# Patient Record
Sex: Male | Born: 1990 | Race: White | Hispanic: No | Marital: Single | Smoking: Former smoker
Health system: Southern US, Community
[De-identification: ages and names within clinical notes are randomized; demographics above are authoritative.]

## PROBLEM LIST (undated history)

## (undated) DIAGNOSIS — Z8614 Personal history of Methicillin resistant Staphylococcus aureus infection: Secondary | ICD-10-CM

## (undated) DIAGNOSIS — F32A Depression, unspecified: Secondary | ICD-10-CM

## (undated) DIAGNOSIS — M2242 Chondromalacia patellae, left knee: Secondary | ICD-10-CM

## (undated) DIAGNOSIS — G9081 Serotonin syndrome: Secondary | ICD-10-CM

## (undated) DIAGNOSIS — F419 Anxiety disorder, unspecified: Secondary | ICD-10-CM

## (undated) DIAGNOSIS — S83289A Other tear of lateral meniscus, current injury, unspecified knee, initial encounter: Secondary | ICD-10-CM

## (undated) HISTORY — PX: ORIF FEMUR FRACTURE: SHX2119

## (undated) HISTORY — DX: Serotonin syndrome: G90.81

## (undated) HISTORY — PX: SEPTOPLASTY: SHX2393

## (undated) HISTORY — DX: Anxiety disorder, unspecified: F41.9

## (undated) HISTORY — PX: WISDOM TOOTH EXTRACTION: SHX21

## (undated) HISTORY — PX: TONSILLECTOMY AND ADENOIDECTOMY: SHX28

---
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
## 2011-03-05 LAB — POCT HEMOGLOBIN-HEMACUE: Hemoglobin: 15.8 — ABNORMAL HIGH

## 2011-05-13 ENCOUNTER — Other Ambulatory Visit: Payer: Self-pay | Admitting: Internal Medicine

## 2011-05-15 NOTE — Telephone Encounter (Signed)
Last OV and fill date 12/18/10

## 2011-05-19 NOTE — Telephone Encounter (Signed)
Pt mom called complaining that pharmacy advised them that they have sent several request without any response. Advise Pt mom Rx sent in to pharmacy and OV due in January.

## 2011-07-10 ENCOUNTER — Encounter: Payer: Self-pay | Admitting: Internal Medicine

## 2011-07-10 ENCOUNTER — Ambulatory Visit (INDEPENDENT_AMBULATORY_CARE_PROVIDER_SITE_OTHER): Payer: 59 | Admitting: Internal Medicine

## 2011-07-10 DIAGNOSIS — R55 Syncope and collapse: Secondary | ICD-10-CM

## 2011-07-10 DIAGNOSIS — F419 Anxiety disorder, unspecified: Secondary | ICD-10-CM

## 2011-07-10 DIAGNOSIS — Z0000 Encounter for general adult medical examination without abnormal findings: Secondary | ICD-10-CM | POA: Insufficient documentation

## 2011-07-10 LAB — COMPREHENSIVE METABOLIC PANEL
ALT: 23 U/L (ref 0–53)
AST: 20 U/L (ref 0–37)
Albumin: 4.1 g/dL (ref 3.5–5.2)
CO2: 28 mEq/L (ref 19–32)
Calcium: 9.2 mg/dL (ref 8.4–10.5)
Chloride: 106 mEq/L (ref 96–112)
GFR: 129.76 mL/min (ref >60.00)
Potassium: 3.8 mEq/L (ref 3.5–5.1)
Total Protein: 7 g/dL (ref 6.0–8.3)

## 2011-07-10 LAB — LIPID PANEL
LDL Cholesterol: 103 mg/dL — ABNORMAL HIGH (ref 0–99)
Total CHOL/HDL Ratio: 4

## 2011-07-10 NOTE — Assessment & Plan Note (Signed)
Asked pt to bring his immunization records  Counseled about: Diet-exercoise-STE-safe driving-safe sex Also risks if ETOH-tobacco-drug abuse

## 2011-07-10 NOTE — Assessment & Plan Note (Signed)
Control, will call for refills. C/o  insomnia, health sleep tips provided, will call if symptoms increase or no better

## 2011-07-10 NOTE — Assessment & Plan Note (Signed)
saw cardiology, they recommend no further workup, patient reports no further syncope

## 2011-07-10 NOTE — Patient Instructions (Signed)
Please get your immunization records to be sure you are update
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
## 2011-07-16 ENCOUNTER — Encounter: Payer: Self-pay | Admitting: Internal Medicine

## 2011-07-21 ENCOUNTER — Other Ambulatory Visit: Payer: Self-pay | Admitting: Internal Medicine

## 2011-07-21 NOTE — Telephone Encounter (Signed)
Refill done.  

## 2011-09-06 ENCOUNTER — Other Ambulatory Visit: Payer: Self-pay | Admitting: Internal Medicine

## 2011-09-09 ENCOUNTER — Other Ambulatory Visit: Payer: Self-pay | Admitting: *Deleted

## 2011-09-09 MED ORDER — CITALOPRAM HYDROBROMIDE 20 MG PO TABS: 20.0000 mg | ORAL_TABLET | Freq: Every day | ORAL | Status: DC
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
## 2011-12-25 ENCOUNTER — Other Ambulatory Visit: Payer: Self-pay | Admitting: Internal Medicine

## 2011-12-25 NOTE — Telephone Encounter (Signed)
Refill done.  

## 2012-02-17 ENCOUNTER — Ambulatory Visit
Admission: RE | Admit: 2012-02-17 | Discharge: 2012-02-17 | Disposition: A | Discharge status: Home / Self Care | Payer: 59 | Source: Ambulatory Visit | Attending: Family Medicine | Admitting: Family Medicine

## 2012-02-17 ENCOUNTER — Other Ambulatory Visit: Payer: Self-pay | Admitting: Family Medicine

## 2012-02-17 DIAGNOSIS — N200 Calculus of kidney: Secondary | ICD-10-CM

## 2012-02-17 DIAGNOSIS — R109 Unspecified abdominal pain: Secondary | ICD-10-CM

## 2012-06-12 ENCOUNTER — Other Ambulatory Visit: Payer: Self-pay | Admitting: Internal Medicine

## 2012-06-14 ENCOUNTER — Encounter: Payer: Self-pay | Admitting: *Deleted

## 2012-06-14 NOTE — Telephone Encounter (Signed)
Refill done.  Letter mailed to pt to advise him he is due for an OV for future refills.

## 2013-06-09 DIAGNOSIS — Z8614 Personal history of Methicillin resistant Staphylococcus aureus infection: Secondary | ICD-10-CM

## 2013-06-09 HISTORY — DX: Personal history of Methicillin resistant Staphylococcus aureus infection: Z86.14

## 2013-11-03 ENCOUNTER — Encounter: Payer: Self-pay | Admitting: Cardiovascular Disease

## 2013-11-03 ENCOUNTER — Ambulatory Visit (INDEPENDENT_AMBULATORY_CARE_PROVIDER_SITE_OTHER): Payer: 59 | Admitting: Cardiovascular Disease

## 2013-11-03 VITALS — BP 124/86 | HR 87 | Ht 70.0 in | Wt 244.9 lb

## 2013-11-03 DIAGNOSIS — F411 Generalized anxiety disorder: Secondary | ICD-10-CM

## 2013-11-03 DIAGNOSIS — F419 Anxiety disorder, unspecified: Secondary | ICD-10-CM

## 2013-11-03 NOTE — Assessment & Plan Note (Signed)
Patient is on Celexa. He was referred by his primary care physician to rule out any heart related conditions. He has no risk factors and is completely asymptomatic. He has had some episodes of syncope years ago but these have not been recurrent. He needs no further workup.

## 2013-11-03 NOTE — Progress Notes (Signed)
     11/03/2013 Derek Hoffman   [birth year]  [id number]  Primary Physician Sissy Hoff, MD Primary Cardiologist: Runell Gess MD Roseanne Reno   HPI:  Derek Hoffman is a 23 year old fit appearing single Caucasian male referred by his primary care physician for cardiovascular evaluation. He was seen by cardiology 3 years ago for syncope thought to be vagal. No further workup or episodes have occurred. He he has no risk factors for heart disease is completely asymptomatic.   Current Outpatient Prescriptions  Medication Sig Dispense Refill  . citalopram (CELEXA) 20 MG tablet take 1 tablet by mouth once daily  30 tablet  0  . esomeprazole (NEXIUM) 20 MG capsule Take 20 mg by mouth daily at 12 noon.       No current facility-administered medications for this visit.    Allergies  Allergen Reactions  . Codeine     History   Social History  . Marital Status: Single    Spouse Name: N/A    Number of Children: N/A  . Years of Education: N/A   Occupational History  . Not on file.   Social History Main Topics  . Smoking status: Former Smoker    Types: Cigarettes    Quit date: 06/09/2010  . Smokeless tobacco: Not on file  . Alcohol Use: Yes     Comment: rare  . Drug Use: No     Comment: No marijuana in >1 year  . Sexual Activity: Not on file   Other Topics Concern  . Not on file   Social History Narrative   Lives w/ MF   Regular exercise- no   Goes to college and works            Review of Systems: General: negative for chills, fever, night sweats or weight changes.  Cardiovascular: negative for chest pain, dyspnea on exertion, edema, orthopnea, palpitations, paroxysmal nocturnal dyspnea or shortness of breath Dermatological: negative for rash Respiratory: negative for cough or wheezing Urologic: negative for hematuria Abdominal: negative for nausea, vomiting, diarrhea, bright red blood per rectum, melena, or hematemesis Neurologic: negative for visual  changes, syncope, or dizziness All other systems reviewed and are otherwise negative except as noted above.    Blood pressure 124/86, pulse 87, height 5\' 10"  (1.778 m), weight 244 lb 14.4 oz (111.086 kg).  General appearance: alert and no distress Neck: no adenopathy, no carotid bruit, no JVD, supple, symmetrical, trachea midline and thyroid not enlarged, symmetric, no tenderness/mass/nodules Lungs: clear to auscultation bilaterally Heart: regular rate and rhythm, S1, S2 normal, no murmur, click, rub or gallop Extremities: extremities normal, atraumatic, no cyanosis or edema and 2+ pedal pulses are laterally  EKG normal sinus rhythm at 87 without ST or T wave changes  ASSESSMENT AND PLAN:   Anxiety Patient is on Celexa. He was referred by his primary care physician to rule out any heart related conditions. He has no risk factors and is completely asymptomatic. He has had some episodes of syncope years ago but these have not been recurrent. He needs no further workup.      Runell Gess MD FACP,FACC,FAHA, [facility] 11/03/2013 4:18 PM

## 2013-11-03 NOTE — Patient Instructions (Signed)
Your physician recommends that you schedule a follow-up appointment in: as needed. No changes were made today in your therapy.Marland Kitchen

## 2014-07-05 ENCOUNTER — Other Ambulatory Visit: Payer: Self-pay | Admitting: Physician Assistant

## 2014-07-05 DIAGNOSIS — R1011 Right upper quadrant pain: Secondary | ICD-10-CM

## 2014-07-05 DIAGNOSIS — R1013 Epigastric pain: Secondary | ICD-10-CM

## 2014-07-05 DIAGNOSIS — R197 Diarrhea, unspecified: Secondary | ICD-10-CM

## 2014-07-06 ENCOUNTER — Ambulatory Visit
Admission: RE | Admit: 2014-07-06 | Discharge: 2014-07-06 | Disposition: A | Discharge status: Home / Self Care | Payer: 59 | Source: Ambulatory Visit | Attending: Physician Assistant | Admitting: Physician Assistant

## 2014-07-06 DIAGNOSIS — R197 Diarrhea, unspecified: Secondary | ICD-10-CM

## 2014-07-06 DIAGNOSIS — R1013 Epigastric pain: Secondary | ICD-10-CM

## 2014-07-06 DIAGNOSIS — R1011 Right upper quadrant pain: Secondary | ICD-10-CM

## 2014-12-08 DIAGNOSIS — S83289A Other tear of lateral meniscus, current injury, unspecified knee, initial encounter: Secondary | ICD-10-CM

## 2014-12-08 DIAGNOSIS — M2242 Chondromalacia patellae, left knee: Secondary | ICD-10-CM

## 2014-12-08 HISTORY — DX: Chondromalacia patellae, left knee: M22.42

## 2014-12-08 HISTORY — DX: Other tear of lateral meniscus, current injury, unspecified knee, initial encounter: S83.289A

## 2015-01-01 ENCOUNTER — Other Ambulatory Visit: Payer: Self-pay | Admitting: Physician Assistant

## 2015-01-01 NOTE — H&P (Signed)
This is a pleasant 24 year-old gentleman who presents to our clinic today for follow up of his left knee.  He saw Slayden back in February of 2016 with left knee pain that he had for about two years.  We tried an intraarticular injection which really only gave him relief with Marcaine in place.  MRI ordered by Dr. Kramer early this year revealed fraying of the lateral meniscus without a discreet tear.   Past medical, social and family history reviewed in detail on the patient questionnaire and signed. Review of systems: As detailed in HPI.  All others reviewed and are negative.   EXAMINATION: Well-developed, well-nourished male in no acute distress.  Alert and oriented x 3.  Examination of his left knee reveals range of motion from 0-125 degrees.  Medial joint line tenderness.  Positive medial McMurray.  He is neurovascularly intact distally.    X-RAYS: Three views of his left knee reveal 50% narrowing of the medial compartment.  IMPRESSION: Degenerative medial meniscus tear, left knee.  PLAN: At this point in time Shamari has given this time, as well as an attempted intraarticular Cortisone injection to relieve his symptoms.  He continues to have symptoms and we feel it is appropriate to proceed with left knee arthroscopy and medial meniscectomy.  Risks, benefits and possible complications of surgery have been reviewed.  Rehab and recovery time discussed.  All questions answered.  We will see Derek Hoffman at the time of operative intervention.  Paperwork complete.  Daniel F. Murphy, M.D.  

## 2015-01-05 ENCOUNTER — Encounter (HOSPITAL_BASED_OUTPATIENT_CLINIC_OR_DEPARTMENT_OTHER): Payer: Self-pay | Admitting: *Deleted

## 2015-01-08 ENCOUNTER — Other Ambulatory Visit: Payer: Self-pay | Admitting: Physician Assistant

## 2015-01-08 NOTE — H&P (Signed)
This is a pleasant 24 year-old gentleman who presents to our clinic today for follow up of his left knee.  He saw Luby back in February of 2016 with left knee pain that he had for about two years.  We tried an intraarticular injection which really only gave him relief with Marcaine in place.  MRI ordered by Dr. Kramer early this year revealed fraying of the lateral meniscus without a discreet tear.   Past medical, social and family history reviewed in detail on the patient questionnaire and signed. Review of systems: As detailed in HPI.  All others reviewed and are negative.   EXAMINATION: Well-developed, well-nourished male in no acute distress.  Alert and oriented x 3.  Examination of his left knee reveals range of motion from 0-125 degrees.  Medial joint line tenderness.  Positive medial McMurray.  He is neurovascularly intact distally.    X-RAYS: Three views of his left knee reveal 50% narrowing of the medial compartment.  IMPRESSION: Degenerative medial meniscus tear, left knee.  PLAN: At this point in time Kairon has given this time, as well as an attempted intraarticular Cortisone injection to relieve his symptoms.  He continues to have symptoms and we feel it is appropriate to proceed with left knee arthroscopy and medial meniscectomy.  Risks, benefits and possible complications of surgery have been reviewed.  Rehab and recovery time discussed.  All questions answered.  We will see Jaquarious at the time of operative intervention.  Paperwork complete.  Daniel F. Murphy, M.D.  

## 2015-01-10 NOTE — Interval H&P Note (Signed)
History and Physical Interval Note:  01/10/2015 8:32 AM  Derek Hoffman  has presented today for surgery, with the diagnosis of CHONDROMALACIA PATELLAE LEFT KNEE OTHER TEAR OF LATERAL MENISCUS CURRENT INJURY UNSPECIFIED KNEE INITIAL ENCOUNTER   The various methods of treatment have been discussed with the patient and family. After consideration of risks, benefits and other options for treatment, the patient has consented to  Procedure(s): LEFT KNEE ARTHROSCOPY CHONDROPLASTY  WITH LATERAL MENISCECTOMY (Left) as a surgical intervention .  The patient's history has been reviewed, patient examined, no change in status, stable for surgery.  I have reviewed the patient's chart and labs.  Questions were answered to the patient's satisfaction.     [name]

## 2015-01-10 NOTE — H&P (View-Only) (Signed)
This is a pleasant 23 year-old gentleman who presents to our clinic today for follow up of his left knee.  He saw Sopheap back in February of 2016 with left knee pain that he had for about two years.  We tried an intraarticular injection which really only gave him relief with Marcaine in place.  MRI ordered by Dr. Kramer early this year revealed fraying of the lateral meniscus without a discreet tear.   Past medical, social and family history reviewed in detail on the patient questionnaire and signed. Review of systems: As detailed in HPI.  All others reviewed and are negative.   EXAMINATION: Well-developed, well-nourished male in no acute distress.  Alert and oriented x 3.  Examination of his left knee reveals range of motion from 0-125 degrees.  Medial joint line tenderness.  Positive medial McMurray.  He is neurovascularly intact distally.    X-RAYS: Three views of his left knee reveal 50% narrowing of the medial compartment.  IMPRESSION: Degenerative medial meniscus tear, left knee.  PLAN: At this point in time Tudor has given this time, as well as an attempted intraarticular Cortisone injection to relieve his symptoms.  He continues to have symptoms and we feel it is appropriate to proceed with left knee arthroscopy and medial meniscectomy.  Risks, benefits and possible complications of surgery have been reviewed.  Rehab and recovery time discussed.  All questions answered.  We will see Lawson at the time of operative intervention.  Paperwork complete.  Daniel F. Murphy, M.D.  

## 2015-01-11 ENCOUNTER — Ambulatory Visit (HOSPITAL_BASED_OUTPATIENT_CLINIC_OR_DEPARTMENT_OTHER): Payer: 59 | Admitting: Anesthesiology

## 2015-01-11 ENCOUNTER — Ambulatory Visit (HOSPITAL_BASED_OUTPATIENT_CLINIC_OR_DEPARTMENT_OTHER)
Admission: RE | Admit: 2015-01-11 | Discharge: 2015-01-11 | Disposition: A | Discharge status: Home / Self Care | Payer: 59 | Source: Ambulatory Visit | Attending: Orthopedic Surgery | Admitting: Orthopedic Surgery

## 2015-01-11 ENCOUNTER — Encounter (HOSPITAL_BASED_OUTPATIENT_CLINIC_OR_DEPARTMENT_OTHER): Payer: Self-pay | Admitting: Anesthesiology

## 2015-01-11 ENCOUNTER — Encounter (HOSPITAL_BASED_OUTPATIENT_CLINIC_OR_DEPARTMENT_OTHER)
Admission: RE | Disposition: A | Discharge status: Home / Self Care | Payer: Self-pay | Source: Ambulatory Visit | Attending: Orthopedic Surgery

## 2015-01-11 DIAGNOSIS — M2242 Chondromalacia patellae, left knee: Secondary | ICD-10-CM | POA: Diagnosis not present

## 2015-01-11 DIAGNOSIS — S83282A Other tear of lateral meniscus, current injury, left knee, initial encounter: Secondary | ICD-10-CM | POA: Insufficient documentation

## 2015-01-11 DIAGNOSIS — Z87891 Personal history of nicotine dependence: Secondary | ICD-10-CM | POA: Diagnosis not present

## 2015-01-11 DIAGNOSIS — X58XXXA Exposure to other specified factors, initial encounter: Secondary | ICD-10-CM | POA: Diagnosis not present

## 2015-01-11 HISTORY — DX: Chondromalacia patellae, left knee: M22.42

## 2015-01-11 HISTORY — PX: KNEE ARTHROSCOPY WITH LATERAL MENISECTOMY: SHX6193

## 2015-01-11 HISTORY — PX: CHONDROPLASTY: SHX5177

## 2015-01-11 HISTORY — DX: Other tear of lateral meniscus, current injury, unspecified knee, initial encounter: S83.289A

## 2015-01-11 HISTORY — DX: Personal history of Methicillin resistant Staphylococcus aureus infection: Z86.14

## 2015-01-11 HISTORY — PX: KNEE ARTHROSCOPY WITH EXCISION PLICA: SHX5647

## 2015-01-11 LAB — POCT HEMOGLOBIN-HEMACUE: HEMOGLOBIN: 16.3 g/dL (ref 13.0–17.0)

## 2015-01-11 SURGERY — ARTHROSCOPY, KNEE, WITH LATERAL MENISCECTOMY
Anesthesia: General | Site: Knee | Laterality: Left

## 2015-01-11 MED ORDER — PROPOFOL 10 MG/ML IV BOLUS
INTRAVENOUS | Status: DC | PRN
  Administered 2015-01-11: 250 mg via INTRAVENOUS

## 2015-01-11 MED ORDER — METHYLPREDNISOLONE ACETATE 80 MG/ML IJ SUSP
INTRAMUSCULAR | Status: AC
  Filled 2015-01-11: qty 1

## 2015-01-11 MED ORDER — LACTATED RINGERS IV SOLN: INTRAVENOUS | Status: DC

## 2015-01-11 MED ORDER — OXYCODONE-ACETAMINOPHEN 5-325 MG PO TABS: 1.0000 | ORAL_TABLET | ORAL | Status: DC | PRN

## 2015-01-11 MED ORDER — METOCLOPRAMIDE HCL 5 MG/ML IJ SOLN: 5.0000 mg | Freq: Three times a day (TID) | INTRAMUSCULAR | Status: DC | PRN

## 2015-01-11 MED ORDER — MIDAZOLAM HCL 2 MG/2ML IJ SOLN
1.0000 mg | INTRAMUSCULAR | Status: DC | PRN
  Administered 2015-01-11: 2 mg via INTRAVENOUS

## 2015-01-11 MED ORDER — BUPIVACAINE HCL (PF) 0.5 % IJ SOLN
INTRAMUSCULAR | Status: DC | PRN
  Administered 2015-01-11: 20 mL via INTRA_ARTICULAR

## 2015-01-11 MED ORDER — ONDANSETRON HCL 4 MG/2ML IJ SOLN
INTRAMUSCULAR | Status: DC | PRN
  Administered 2015-01-11: 4 mg via INTRAVENOUS

## 2015-01-11 MED ORDER — KETOROLAC TROMETHAMINE 30 MG/ML IJ SOLN
INTRAMUSCULAR | Status: DC | PRN
  Administered 2015-01-11: 30 mg via INTRAVENOUS

## 2015-01-11 MED ORDER — CEFAZOLIN SODIUM-DEXTROSE 2-3 GM-% IV SOLR
INTRAVENOUS | Status: AC
  Filled 2015-01-11: qty 50

## 2015-01-11 MED ORDER — BUPIVACAINE HCL (PF) 0.5 % IJ SOLN
INTRAMUSCULAR | Status: AC
  Filled 2015-01-11: qty 30

## 2015-01-11 MED ORDER — CEFAZOLIN SODIUM-DEXTROSE 2-3 GM-% IV SOLR
2.0000 g | INTRAVENOUS | Status: AC
  Administered 2015-01-11: 2 g via INTRAVENOUS

## 2015-01-11 MED ORDER — CHLORHEXIDINE GLUCONATE 4 % EX LIQD: 60.0000 mL | Freq: Once | CUTANEOUS | Status: DC

## 2015-01-11 MED ORDER — FENTANYL CITRATE (PF) 100 MCG/2ML IJ SOLN
50.0000 ug | INTRAMUSCULAR | Status: DC | PRN
  Administered 2015-01-11: 100 ug via INTRAVENOUS

## 2015-01-11 MED ORDER — LIDOCAINE HCL (CARDIAC) 20 MG/ML IV SOLN
INTRAVENOUS | Status: DC | PRN
  Administered 2015-01-11: 50 mg via INTRAVENOUS

## 2015-01-11 MED ORDER — HYDROMORPHONE HCL 1 MG/ML IJ SOLN: 0.5000 mg | INTRAMUSCULAR | Status: DC | PRN

## 2015-01-11 MED ORDER — HYDROMORPHONE HCL 1 MG/ML IJ SOLN: 0.2500 mg | INTRAMUSCULAR | Status: DC | PRN

## 2015-01-11 MED ORDER — DEXAMETHASONE SODIUM PHOSPHATE 4 MG/ML IJ SOLN
INTRAMUSCULAR | Status: DC | PRN
  Administered 2015-01-11: 10 mg via INTRAVENOUS

## 2015-01-11 MED ORDER — METHYLPREDNISOLONE ACETATE 80 MG/ML IJ SUSP
INTRAMUSCULAR | Status: DC | PRN
  Administered 2015-01-11: 80 mg via INTRA_ARTICULAR

## 2015-01-11 MED ORDER — METHOCARBAMOL 500 MG PO TABS: 500.0000 mg | ORAL_TABLET | Freq: Four times a day (QID) | ORAL | Status: DC | PRN

## 2015-01-11 MED ORDER — METOCLOPRAMIDE HCL 5 MG PO TABS: 5.0000 mg | ORAL_TABLET | Freq: Three times a day (TID) | ORAL | Status: DC | PRN

## 2015-01-11 MED ORDER — MORPHINE SULFATE 4 MG/ML IJ SOLN: 0.0500 mg/kg | INTRAMUSCULAR | Status: DC | PRN

## 2015-01-11 MED ORDER — LACTATED RINGERS IV SOLN
INTRAVENOUS | Status: DC
  Administered 2015-01-11: 08:00:00 via INTRAVENOUS

## 2015-01-11 MED ORDER — METHOCARBAMOL 1000 MG/10ML IJ SOLN: 500.0000 mg | Freq: Four times a day (QID) | INTRAVENOUS | Status: DC | PRN

## 2015-01-11 MED ORDER — ONDANSETRON HCL 4 MG/2ML IJ SOLN: 4.0000 mg | Freq: Four times a day (QID) | INTRAMUSCULAR | Status: DC | PRN

## 2015-01-11 MED ORDER — GLYCOPYRROLATE 0.2 MG/ML IJ SOLN: 0.2000 mg | Freq: Once | INTRAMUSCULAR | Status: DC | PRN

## 2015-01-11 MED ORDER — ONDANSETRON HCL 4 MG PO TABS: 4.0000 mg | ORAL_TABLET | Freq: Three times a day (TID) | ORAL | Status: DC | PRN

## 2015-01-11 MED ORDER — SCOPOLAMINE 1 MG/3DAYS TD PT72: 1.0000 | MEDICATED_PATCH | Freq: Once | TRANSDERMAL | Status: DC | PRN

## 2015-01-11 MED ORDER — MIDAZOLAM HCL 2 MG/2ML IJ SOLN
INTRAMUSCULAR | Status: AC
  Filled 2015-01-11: qty 2

## 2015-01-11 MED ORDER — ONDANSETRON HCL 4 MG PO TABS: 4.0000 mg | ORAL_TABLET | Freq: Four times a day (QID) | ORAL | Status: DC | PRN

## 2015-01-11 MED ORDER — FENTANYL CITRATE (PF) 100 MCG/2ML IJ SOLN
INTRAMUSCULAR | Status: AC
  Filled 2015-01-11: qty 6

## 2015-01-11 MED ORDER — SODIUM CHLORIDE 0.9 % IR SOLN
Status: DC | PRN
  Administered 2015-01-11: 5800 mL

## 2015-01-11 SURGICAL SUPPLY — 41 items
BANDAGE ELASTIC 6 VELCRO ST LF (GAUZE/BANDAGES/DRESSINGS) ×2 IMPLANT
BLADE CUDA 5.5 (BLADE) IMPLANT
BLADE CUDA GRT WHITE 3.5 (BLADE) IMPLANT
BLADE CUTTER GATOR 3.5 (BLADE) ×2 IMPLANT
BLADE CUTTER MENIS 5.5 (BLADE) IMPLANT
BLADE GREAT WHITE 4.2 (BLADE) ×2 IMPLANT
BUR OVAL 4.0 (BURR) IMPLANT
CUTTER MENISCUS  4.2MM (BLADE)
CUTTER MENISCUS 4.2MM (BLADE) IMPLANT
DRAPE ARTHROSCOPY W/POUCH 90 (DRAPES) ×2 IMPLANT
DURAPREP 26ML APPLICATOR (WOUND CARE) ×2 IMPLANT
ELECT MENISCUS 165MM 90D (ELECTRODE) IMPLANT
ELECT REM PT RETURN 9FT ADLT (ELECTROSURGICAL)
ELECTRODE REM PT RTRN 9FT ADLT (ELECTROSURGICAL) IMPLANT
GAUZE SPONGE 4X4 12PLY STRL (GAUZE/BANDAGES/DRESSINGS) ×4 IMPLANT
GAUZE XEROFORM 1X8 LF (GAUZE/BANDAGES/DRESSINGS) ×2 IMPLANT
GLOVE BIO SURGEON STRL SZ 6.5 (GLOVE) ×2 IMPLANT
GLOVE BIOGEL PI IND STRL 7.0 (GLOVE) ×4 IMPLANT
GLOVE BIOGEL PI INDICATOR 7.0 (GLOVE) ×4
GLOVE ECLIPSE 6.5 STRL STRAW (GLOVE) ×2 IMPLANT
GLOVE ECLIPSE 7.0 STRL STRAW (GLOVE) ×2 IMPLANT
GLOVE ORTHO TXT STRL SZ7.5 (GLOVE) IMPLANT
GLOVE SURG ORTHO 8.0 STRL STRW (GLOVE) ×2 IMPLANT
GOWN STRL REUS W/ TWL LRG LVL3 (GOWN DISPOSABLE) ×3 IMPLANT
GOWN STRL REUS W/ TWL XL LVL3 (GOWN DISPOSABLE) ×1 IMPLANT
GOWN STRL REUS W/TWL LRG LVL3 (GOWN DISPOSABLE) ×3
GOWN STRL REUS W/TWL XL LVL3 (GOWN DISPOSABLE) ×1
HOLDER KNEE FOAM BLUE (MISCELLANEOUS) ×2 IMPLANT
IV NS IRRIG 3000ML ARTHROMATIC (IV SOLUTION) ×4 IMPLANT
KNEE WRAP E Z 3 GEL PACK (MISCELLANEOUS) ×2 IMPLANT
MANIFOLD NEPTUNE II (INSTRUMENTS) ×2 IMPLANT
NEEDLE HYPO 22GX1.5 SAFETY (NEEDLE) ×2 IMPLANT
PACK ARTHROSCOPY DSU (CUSTOM PROCEDURE TRAY) ×2 IMPLANT
PACK BASIN DAY SURGERY FS (CUSTOM PROCEDURE TRAY) ×2 IMPLANT
PENCIL BUTTON HOLSTER BLD 10FT (ELECTRODE) IMPLANT
SET ARTHROSCOPY TUBING (MISCELLANEOUS) ×1
SET ARTHROSCOPY TUBING LN (MISCELLANEOUS) ×1 IMPLANT
SUT ETHILON 3 0 PS 1 (SUTURE) ×2 IMPLANT
SUT VIC AB 3-0 FS2 27 (SUTURE) IMPLANT
TOWEL OR 17X24 6PK STRL BLUE (TOWEL DISPOSABLE) ×2 IMPLANT
WATER STERILE IRR 1000ML POUR (IV SOLUTION) ×2 IMPLANT

## 2015-01-11 NOTE — Discharge Instructions (Signed)

## 2015-01-11 NOTE — H&P (View-Only) (Signed)
This is a pleasant 24 year-old gentleman who presents to our clinic today for follow up of his left knee.  He saw Andi back in February of 2016 with left knee pain that he had for about two years.  We tried an intraarticular injection which really only gave him relief with Marcaine in place.  MRI ordered by Dr. Farris Has early this year revealed fraying of the lateral meniscus without a discreet tear.   Past medical, social and family history reviewed in detail on the patient questionnaire and signed. Review of systems: As detailed in HPI.  All others reviewed and are negative.   EXAMINATION: Well-developed, well-nourished male in no acute distress.  Alert and oriented x 3.  Examination of his left knee reveals range of motion from 0-125 degrees.  Medial joint line tenderness.  Positive medial McMurray.  He is neurovascularly intact distally.    X-RAYS: Three views of his left knee reveal 50% narrowing of the medial compartment.  IMPRESSION: Degenerative medial meniscus tear, left knee.  PLAN: At this point in time Gillian has given this time, as well as an attempted intraarticular Cortisone injection to relieve his symptoms.  He continues to have symptoms and we feel it is appropriate to proceed with left knee arthroscopy and medial meniscectomy.  Risks, benefits and possible complications of surgery have been reviewed.  Rehab and recovery time discussed.  All questions answered.  We will see Daeshaun at the time of operative intervention.  Paperwork complete.  [name], M.D.

## 2015-01-11 NOTE — Anesthesia Preprocedure Evaluation (Addendum)
Anesthesia Evaluation  Patient identified by MRN, date of birth, ID band Patient awake    Reviewed: Allergy & Precautions, NPO status , Patient's Chart, lab work & pertinent test results  Airway Mallampati: I  TM Distance: >3 FB Neck ROM: Full    Dental   Pulmonary former smoker,  breath sounds clear to auscultation        Cardiovascular negative cardio ROS  Rhythm:Regular Rate:Normal     Neuro/Psych    GI/Hepatic negative GI ROS, Neg liver ROS,   Endo/Other    Renal/GU negative Renal ROS     Musculoskeletal   Abdominal   Peds  Hematology   Anesthesia Other Findings   Reproductive/Obstetrics                            Anesthesia Physical Anesthesia Plan  ASA: II  Anesthesia Plan:    Post-op Pain Management:    Induction: Intravenous  Airway Management Planned: LMA  Additional Equipment:   Intra-op Plan:   Post-operative Plan: Extubation in OR  Informed Consent: I have reviewed the patients History and Physical, chart, labs and discussed the procedure including the risks, benefits and alternatives for the proposed anesthesia with the patient or authorized representative who has indicated his/her understanding and acceptance.   Dental advisory given  Plan Discussed with: CRNA and Anesthesiologist  Anesthesia Plan Comments:         Anesthesia Quick Evaluation

## 2015-01-11 NOTE — Anesthesia Procedure Notes (Signed)
Procedure Name: LMA Insertion Performed by: York Grice Pre-anesthesia Checklist: Patient identified, Timeout performed, Emergency Drugs available, Suction available and Patient being monitored Patient Re-evaluated:Patient Re-evaluated prior to inductionOxygen Delivery Method: Circle system utilized Preoxygenation: Pre-oxygenation with 100% oxygen Intubation Type: IV induction Ventilation: Mask ventilation without difficulty LMA: LMA with gastric port inserted LMA Size: 4.0 Tube type: Oral Number of attempts: 1 Placement Confirmation: positive ETCO2 and breath sounds checked- equal and bilateral Tube secured with: Tape Dental Injury: Teeth and Oropharynx as per pre-operative assessment

## 2015-01-11 NOTE — Transfer of Care (Signed)
Immediate Anesthesia Transfer of Care Note  Patient: Derek Hoffman  Procedure(s) Performed: Procedure(s): LEFT KNEE ARTHROSCOPY  WITH LATERAL MENISCECTOMY, EXCISION OF MEDIAL PLICA, CHONDROPLASTY (Left) KNEE ARTHROSCOPY WITH EXCISION PLICA (Left) CHONDROPLASTY (Left)  Patient Location: PACU  Anesthesia Type:General  Level of Consciousness: awake and sedated  Airway & Oxygen Therapy: Patient Spontanous Breathing and Patient connected to face mask oxygen  Post-op Assessment: Report given to RN and Post -op Vital signs reviewed and stable  Post vital signs: Reviewed and stable  Last Vitals:  Filed Vitals:   01/11/15 0806  BP: 142/83  Pulse: 57  Temp: 36.7 C  Resp: 18    Complications: No apparent anesthesia complications

## 2015-01-11 NOTE — Anesthesia Postprocedure Evaluation (Signed)
  Anesthesia Post-op Note  Patient: Derek Hoffman  Procedure(s) Performed: Procedure(s): LEFT KNEE ARTHROSCOPY  WITH LATERAL MENISCECTOMY, EXCISION OF MEDIAL PLICA, CHONDROPLASTY (Left) KNEE ARTHROSCOPY WITH EXCISION PLICA (Left) CHONDROPLASTY (Left)  Patient Location: PACU  Anesthesia Type:General  Level of Consciousness: awake  Airway and Oxygen Therapy: Patient Spontanous Breathing  Post-op Pain: mild  Post-op Assessment: Post-op Vital signs reviewed LLE Motor Response: Purposeful movement LLE Sensation: Full sensation          Post-op Vital Signs: Reviewed  Last Vitals:  Filed Vitals:   01/11/15 1145  BP: 140/86  Pulse: 66  Temp: 36.7 C  Resp: 20    Complications: No apparent anesthesia complications

## 2015-01-11 NOTE — Interval H&P Note (Signed)
History and Physical Interval Note:  01/11/2015 9:44 AM  Derek Hoffman  has presented today for surgery, with the diagnosis of CHONDROMALACIA PATELLAE LEFT KNEE OTHER TEAR OF LATERAL MENISCUS CURRENT INJURY UNSPECIFIED KNEE INITIAL ENCOUNTER   The various methods of treatment have been discussed with the patient and family. After consideration of risks, benefits and other options for treatment, the patient has consented to  Procedure(s): LEFT KNEE ARTHROSCOPY CHONDROPLASTY  WITH LATERAL MENISCECTOMY (Left) as a surgical intervention .  The patient's history has been reviewed, patient examined, no change in status, stable for surgery.  I have reviewed the patient's chart and labs.  Questions were answered to the patient's satisfaction.     [name]

## 2015-01-12 ENCOUNTER — Encounter (HOSPITAL_BASED_OUTPATIENT_CLINIC_OR_DEPARTMENT_OTHER): Payer: Self-pay | Admitting: Orthopedic Surgery

## 2015-01-12 NOTE — Op Note (Signed)
NAME:  Derek Hoffman, Derek Hoffman NO.:  [id number]  MEDICAL RECORD NO.:  [id number]  LOCATION:                               FACILITY:  [facility]  PHYSICIAN:  [name], M.D. DATE OF BIRTH:  June 05, 1991  DATE OF PROCEDURE:  01/11/2015 DATE OF DISCHARGE:  01/11/2015                              OPERATIVE REPORT   PREOPERATIVE DIAGNOSIS:  Lateral meniscus tear, left knee.  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  PROCEDURE:  Left knee exam under anesthesia, arthroscopy.  Partial lateral meniscectomy.  Chondroplasty of patella and lateral compartment. Excised medial plica.  SURGEON:  [name], MD.  ASSISTANT:  Mikey Kirschner, PA.  ANESTHESIA:  General.  BLOOD LOSS:  Minimal.  SPECIMENS:  None.  CULTURES:  None.  COMPLICATIONS:  None.  DRESSINGS:  Soft compressive.  TOURNIQUET:  Not employed.  DESCRIPTION OF PROCEDURE:  The patient was brought to the operating room, placed on the operating table in supine position.  After adequate anesthesia had been obtained, a leg holder applied.  Leg prepped and draped in usual sterile fashion.  Two portals, one each medial and lateral parapatellar.  Arthroscope was introduced.  Knee distended and inspected.  Reasonable patellar tracking.  Large fibrotic medial plica excised.  Area of grooving on the medial femoral condyle under the plica debrided.  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  Tracking acceptable.  Medial meniscus, medial compartment, and cruciate ligaments intact.  A little ACL laxity __________ to firm endpoint.  Radial tearing middle and anterior third lateral meniscus. Saucerized out to a stable rim, tapered in smoothly.  Grade 2 changes on the plateau and femur debrided.  Entire knee examined, no  other findings were appreciated.  Instruments and fluid removed.  Portals were closed with nylon.  Knee injected with Depo-Medrol and Marcaine. Sterile compressive dressing applied.  Anesthesia reversed.  Brought to the recovery room.  Tolerated the surgery well.  No complications.     [name], M.D.     DFM/MEDQ  D:  01/11/2015  T:  01/11/2015  Job:  [id number]

## 2015-03-06 DIAGNOSIS — G8929 Other chronic pain: Secondary | ICD-10-CM | POA: Insufficient documentation

## 2015-03-07 ENCOUNTER — Other Ambulatory Visit: Payer: Self-pay | Admitting: Orthopedic Surgery

## 2015-03-07 DIAGNOSIS — M25562 Pain in left knee: Secondary | ICD-10-CM

## 2015-03-15 ENCOUNTER — Other Ambulatory Visit: Payer: Self-pay | Admitting: Orthopedic Surgery

## 2015-03-15 ENCOUNTER — Ambulatory Visit
Admission: RE | Admit: 2015-03-15 | Discharge: 2015-03-15 | Disposition: A | Discharge status: Home / Self Care | Payer: 59 | Source: Ambulatory Visit | Attending: Orthopedic Surgery | Admitting: Orthopedic Surgery

## 2015-03-15 DIAGNOSIS — Z139 Encounter for screening, unspecified: Secondary | ICD-10-CM

## 2015-03-15 DIAGNOSIS — M25562 Pain in left knee: Secondary | ICD-10-CM

## 2015-03-20 DIAGNOSIS — M1732 Unilateral post-traumatic osteoarthritis, left knee: Secondary | ICD-10-CM | POA: Insufficient documentation

## 2015-05-28 DIAGNOSIS — Z9889 Other specified postprocedural states: Secondary | ICD-10-CM | POA: Insufficient documentation

## 2015-09-03 DIAGNOSIS — S76112A Strain of left quadriceps muscle, fascia and tendon, initial encounter: Secondary | ICD-10-CM | POA: Insufficient documentation

## 2016-08-28 ENCOUNTER — Emergency Department (HOSPITAL_COMMUNITY)
Admission: EM | Admit: 2016-08-28 | Discharge: 2016-08-28 | Disposition: A | Discharge status: Home / Self Care | Payer: 59 | Attending: Emergency Medicine | Admitting: Emergency Medicine

## 2016-08-28 ENCOUNTER — Emergency Department (HOSPITAL_COMMUNITY): Payer: 59

## 2016-08-28 ENCOUNTER — Encounter (HOSPITAL_COMMUNITY): Payer: Self-pay

## 2016-08-28 DIAGNOSIS — Z7982 Long term (current) use of aspirin: Secondary | ICD-10-CM | POA: Diagnosis not present

## 2016-08-28 DIAGNOSIS — Z79899 Other long term (current) drug therapy: Secondary | ICD-10-CM | POA: Diagnosis not present

## 2016-08-28 DIAGNOSIS — Z87891 Personal history of nicotine dependence: Secondary | ICD-10-CM | POA: Insufficient documentation

## 2016-08-28 DIAGNOSIS — R1084 Generalized abdominal pain: Secondary | ICD-10-CM | POA: Diagnosis present

## 2016-08-28 DIAGNOSIS — K2900 Acute gastritis without bleeding: Secondary | ICD-10-CM | POA: Insufficient documentation

## 2016-08-28 LAB — CBC
HCT: 48.5 % (ref 39.0–52.0)
HEMOGLOBIN: 16.7 g/dL (ref 13.0–17.0)
MCH: 29 pg (ref 26.0–34.0)
MCHC: 34.4 g/dL (ref 30.0–36.0)
MCV: 84.3 fL (ref 78.0–100.0)
PLATELETS: 183 10*3/uL (ref 150–400)
RBC: 5.75 MIL/uL (ref 4.22–5.81)
RDW: 12.6 % (ref 11.5–15.5)
WBC: 7.3 10*3/uL (ref 4.0–10.5)

## 2016-08-28 LAB — COMPREHENSIVE METABOLIC PANEL
ALK PHOS: 78 U/L (ref 38–126)
ALT: 36 U/L (ref 17–63)
ANION GAP: 8 (ref 5–15)
AST: 29 U/L (ref 15–41)
Albumin: 4.3 g/dL (ref 3.5–5.0)
BUN: 9 mg/dL (ref 6–20)
CALCIUM: 9.8 mg/dL (ref 8.9–10.3)
CO2: 30 mmol/L (ref 22–32)
CREATININE: 1.03 mg/dL (ref 0.61–1.24)
Chloride: 102 mmol/L (ref 101–111)
Glucose, Bld: 89 mg/dL (ref 65–99)
Potassium: 4.1 mmol/L (ref 3.5–5.1)
SODIUM: 140 mmol/L (ref 135–145)
TOTAL PROTEIN: 7.4 g/dL (ref 6.5–8.1)
Total Bilirubin: 0.8 mg/dL (ref 0.3–1.2)

## 2016-08-28 LAB — URINALYSIS, ROUTINE W REFLEX MICROSCOPIC
Bilirubin Urine: NEGATIVE
Glucose, UA: NEGATIVE mg/dL
HGB URINE DIPSTICK: NEGATIVE
Ketones, ur: NEGATIVE mg/dL
LEUKOCYTES UA: NEGATIVE
NITRITE: NEGATIVE
PROTEIN: NEGATIVE mg/dL
SPECIFIC GRAVITY, URINE: 1.006 (ref 1.005–1.030)
pH: 6 (ref 5.0–8.0)

## 2016-08-28 LAB — LIPASE, BLOOD: Lipase: 21 U/L (ref 11–51)

## 2016-08-28 MED ORDER — SUCRALFATE 1 GM/10ML PO SUSP: 1.0000 g | Freq: Three times a day (TID) | ORAL | 0 refills | Status: DC

## 2016-08-28 MED ORDER — ONDANSETRON HCL 4 MG PO TABS: 4.0000 mg | ORAL_TABLET | Freq: Four times a day (QID) | ORAL | 0 refills | Status: DC | PRN

## 2016-08-28 MED ORDER — MORPHINE SULFATE (PF) 4 MG/ML IV SOLN
4.0000 mg | Freq: Once | INTRAVENOUS | Status: AC
  Administered 2016-08-28: 4 mg via INTRAVENOUS
  Filled 2016-08-28: qty 1

## 2016-08-28 MED ORDER — PANTOPRAZOLE SODIUM 40 MG IV SOLR
40.0000 mg | Freq: Once | INTRAVENOUS | Status: AC
  Administered 2016-08-28: 40 mg via INTRAVENOUS
  Filled 2016-08-28: qty 40

## 2016-08-28 MED ORDER — SODIUM CHLORIDE 0.9 % IV BOLUS (SEPSIS)
2000.0000 mL | Freq: Once | INTRAVENOUS | Status: AC
  Administered 2016-08-28: 2000 mL via INTRAVENOUS

## 2016-08-28 MED ORDER — PANTOPRAZOLE SODIUM 20 MG PO TBEC: 20.0000 mg | DELAYED_RELEASE_TABLET | Freq: Two times a day (BID) | ORAL | 0 refills | Status: DC

## 2016-08-28 MED ORDER — IOPAMIDOL (ISOVUE-300) INJECTION 61%
INTRAVENOUS | Status: AC
  Administered 2016-08-28: 100 mL
  Filled 2016-08-28: qty 100

## 2016-08-28 MED ORDER — ONDANSETRON HCL 4 MG/2ML IJ SOLN
4.0000 mg | Freq: Once | INTRAMUSCULAR | Status: AC
  Administered 2016-08-28: 4 mg via INTRAVENOUS
  Filled 2016-08-28: qty 2

## 2016-08-28 NOTE — ED Notes (Signed)
Pt tolerated gingerale and ate all of peanut butter and crackers. Dr. Ranae PalmsYelverton aware.

## 2016-08-28 NOTE — ED Provider Notes (Signed)
[facility]-EMERGENCY DEPT Provider Note   CSN: [id number] Arrival date & time: 08/28/16  1510     History   Chief Complaint Chief Complaint  Patient presents with  . Abdominal Pain    HPI Derek Hoffman is a 26 y.o. male.  HPI Patient presents with abdominal pain starting 5 days ago. States he was out drinking alcohol the night before. Abdominal pain is mostly in the upper abdomen. Associated with multiple bouts of vomiting. No blood or coffee ground emesis. Patient has had a few loose stools but no evidence of blood. No fever or chills. Has had decreased appetite. Denies frequent NSAID intake. Was seen in urgent care and sent to the emergency department to rule out appendicitis. Past Medical History:  Diagnosis Date  . Anxiety   . 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 History of MRSA infection 2015   left arm  . Lateral meniscus tear 12/2014   left    Patient Active Problem List   Diagnosis Date Noted  . General medical examination 07/10/2011  . Overweight(278.02) 12/26/2010  . Anxiety 12/18/2010    Past Surgical History:  Procedure Laterality Date  . CHONDROPLASTY Left 01/11/2015   Procedure: CHONDROPLASTY;  Surgeon: [name], MD;  Location: [facility];  Service: Orthopedics;  Laterality: Left;  . FEMUR HARDWARE REMOVAL Left 10/28/2007  . KNEE ARTHROSCOPY Left 10/28/2007   with manipulation  . KNEE ARTHROSCOPY WITH EXCISION PLICA Left 01/11/2015   Procedure: KNEE ARTHROSCOPY WITH EXCISION PLICA;  Surgeon: [name], MD;  Location: [facility];  Service: Orthopedics;  Laterality: Left;  . KNEE ARTHROSCOPY WITH LATERAL MENISECTOMY Left 01/11/2015   Procedure: LEFT KNEE ARTHROSCOPY  WITH LATERAL MENISCECTOMY, EXCISION OF MEDIAL PLICA, CHONDROPLASTY;  Surgeon: [name], MD;  Location: [facility];  Service: Orthopedics;  Laterality: Left;  . ORIF FEMUR FRACTURE Left   . SEPTOPLASTY    . TONSILLECTOMY AND ADENOIDECTOMY     . WISDOM TOOTH EXTRACTION         Home Medications    Prior to Admission medications   Medication Sig Start Date End Date Taking? Authorizing Provider  Aspirin Effervescent (ALKA-SELTZER EXTRA STRENGTH) 500 MG TBEF Take 1-2 tablets by mouth every 6 (six) hours as needed (for cold symptoms).   Yes Historical Provider, MD  citalopram (CELEXA) 20 MG tablet take 1 tablet by mouth once daily Patient taking differently: Take 20 mg by mouth once a day 06/12/12  Yes Wanda Plump, MD  loratadine (CLARITIN) 10 MG tablet Take 10 mg by mouth daily as needed for allergies.   Yes Historical Provider, MD  VYVANSE 30 MG capsule Take 30 mg by mouth every morning. ONLY ON WORKDAYS 08/04/16  Yes Historical Provider, MD  ondansetron (ZOFRAN) 4 MG tablet Take 1 tablet (4 mg total) by mouth 4 (four) times daily as needed for nausea or vomiting. 08/28/16   Loren Racer, MD  oxyCODONE-acetaminophen (ROXICET) 5-325 MG per tablet Take 1-2 tablets by mouth every 4 (four) hours as needed. Patient not taking: Reported on 08/28/2016 01/11/15   Cristie Hem, PA-C  pantoprazole (PROTONIX) 20 MG tablet Take 1 tablet (20 mg total) by mouth 2 (two) times daily. 08/28/16   Loren Racer, MD  sucralfate (CARAFATE) 1 GM/10ML suspension Take 10 mLs (1 g total) by mouth 4 (four) times daily -  with meals and at bedtime. 08/28/16   Loren Racer, MD    Family History Family History  Problem Relation  Age of Onset  . Hypertension Father     Social History Social History  Substance Use Topics  . Smoking status: Former Smoker    Quit date: 06/08/2010  . Smokeless tobacco: Current User    Types: Snuff  . Alcohol use Yes     Comment: occasionally     Allergies   Patient has no known allergies.   Review of Systems Review of Systems  Constitutional: Positive for fatigue. Negative for chills and fever.  Respiratory: Negative for cough and shortness of breath.   Cardiovascular: Negative for chest pain.    Gastrointestinal: Positive for abdominal pain, diarrhea, nausea and vomiting. Negative for blood in stool and constipation.  Genitourinary: Negative for dysuria, flank pain, frequency and hematuria.  Musculoskeletal: Negative for back pain, myalgias, neck pain and neck stiffness.  Neurological: Negative for syncope, weakness, numbness and headaches.  All other systems reviewed and are negative.    Physical Exam Updated Vital Signs BP (!) 143/86 (BP Location: Right Arm)   Pulse 83   Temp 98.3 F (36.8 C) (Oral)   Resp 16   Ht 5\' 11"  (1.803 m)   Wt 245 lb (111.1 kg)   SpO2 98%   BMI 34.17 kg/m   Physical Exam  Constitutional: He is oriented to person, place, and time. He appears well-developed and well-nourished. No distress.  HENT:  Head: Normocephalic and atraumatic.  Mouth/Throat: Oropharynx is clear and moist. No oropharyngeal exudate.  Eyes: EOM are normal. Pupils are equal, round, and reactive to light.  Neck: Normal range of motion. Neck supple.  Cardiovascular: Normal rate and regular rhythm.  Exam reveals no gallop and no friction rub.   No murmur heard. Pulmonary/Chest: Effort normal and breath sounds normal. No respiratory distress. He has no wheezes. He has no rales. He exhibits no tenderness.  Abdominal: Soft. Bowel sounds are normal. There is tenderness. There is no rebound and no guarding.  Diffusely tender abdomen that appears most tender in the epigastric region. Does have mild right lower quadrant tenderness but there is no rebound or guarding. Rovsing sign is negative.  Musculoskeletal: Normal range of motion. He exhibits no edema or tenderness.  No CVA tenderness bilaterally.  Neurological: He is alert and oriented to person, place, and time.  Moving all extremities without deficit. Sensation fully intact.  Skin: Skin is warm and dry. No rash noted. No erythema.  Psychiatric: He has a normal mood and affect. His behavior is normal.  Nursing note and vitals  reviewed.    ED Treatments / Results  Labs (all labs ordered are listed, but only abnormal results are displayed) Labs Reviewed  URINALYSIS, ROUTINE W REFLEX MICROSCOPIC - Abnormal; Notable for the following:       Result Value   Color, Urine STRAW (*)    All other components within normal limits  LIPASE, BLOOD  COMPREHENSIVE METABOLIC PANEL  CBC    EKG  EKG Interpretation None       Radiology Ct Abdomen Pelvis W Contrast  Result Date: 08/28/2016 CLINICAL DATA:  Acute onset of generalized abdominal pain, nausea and vomiting. Loss of appetite. Initial encounter. EXAM: CT ABDOMEN AND PELVIS WITH CONTRAST TECHNIQUE: Multidetector CT imaging of the abdomen and pelvis was performed using the standard protocol following bolus administration of intravenous contrast. CONTRAST:  ISOVUE-300 IOPAMIDOL (ISOVUE-300) INJECTION 61% COMPARISON:  None. FINDINGS: Lower chest: The visualized lung bases are grossly clear. The visualized portions of the mediastinum are unremarkable. Hepatobiliary: The liver is unremarkable in  appearance. The gallbladder is unremarkable in appearance. The common bile duct remains normal in caliber. Pancreas: The pancreas is within normal limits. Spleen: The spleen is unremarkable in appearance. Adrenals/Urinary Tract: The adrenal glands are unremarkable in appearance. The kidneys are within normal limits. There is no evidence of hydronephrosis. No renal or ureteral stones are identified. No perinephric stranding is seen. Stomach/Bowel: The stomach is unremarkable in appearance. The small bowel is within normal limits. The appendix is normal in caliber, without evidence of appendicitis. The colon is unremarkable in appearance. Vascular/Lymphatic: The abdominal aorta is unremarkable in appearance. The inferior vena cava is grossly unremarkable. No retroperitoneal lymphadenopathy is seen. No pelvic sidewall lymphadenopathy is identified. Reproductive: The bladder is mildly  distended and grossly unremarkable. The prostate remains normal in size. Other: No additional soft tissue abnormalities are seen. Musculoskeletal: No acute osseous abnormalities are identified. The visualized musculature is unremarkable in appearance. IMPRESSION: Unremarkable contrast-enhanced CT of the abdomen and pelvis. Electronically Signed   By: Roanna RaiderJeffery  Chang M.D.   On: 08/28/2016 18:53    Procedures Procedures (including critical care time)  Medications Ordered in ED Medications  sodium chloride 0.9 % bolus 2,000 mL (0 mLs Intravenous Stopped 08/28/16 2002)  pantoprazole (PROTONIX) injection 40 mg (40 mg Intravenous Given 08/28/16 1810)  morphine 4 MG/ML injection 4 mg (4 mg Intravenous Given 08/28/16 1808)  ondansetron (ZOFRAN) injection 4 mg (4 mg Intravenous Given 08/28/16 1806)  iopamidol (ISOVUE-300) 61 % injection (100 mLs  Contrast Given 08/28/16 1825)     Initial Impression / Assessment and Plan / ED Course  I have reviewed the triage vital signs and the nursing notes.  Pertinent labs & imaging results that were available during my care of the patient were reviewed by me and considered in my medical decision making (see chart for details).     CT abdomen without acute findings. Normal white blood cell count. GlycoLax or normal. Given IV fluids, PPI, antiemetics and GI cocktail. Patient states his symptoms have improved. We will orally challenge.  CT without evidence of appendicitis. Patient's symptoms improved after medication. He is tolerating fluids without difficulty. Repeat abdominal exam still reveals diffuse tenderness but mostly in the epigastrium. Will start on PPI, Carafate and Zofran. Patient is advised to follow-up with gastroenterology if symptoms persists. May need endoscopy to rule out peptic ulcer disease. Return precautions given. Final Clinical Impressions(s) / ED Diagnoses   Final diagnoses:  Generalized abdominal pain  Acute gastritis without hemorrhage,  unspecified gastritis type    New Prescriptions New Prescriptions   ONDANSETRON (ZOFRAN) 4 MG TABLET    Take 1 tablet (4 mg total) by mouth 4 (four) times daily as needed for nausea or vomiting.   PANTOPRAZOLE (PROTONIX) 20 MG TABLET    Take 1 tablet (20 mg total) by mouth 2 (two) times daily.   SUCRALFATE (CARAFATE) 1 GM/10ML SUSPENSION    Take 10 mLs (1 g total) by mouth 4 (four) times daily -  with meals and at bedtime.     Loren Raceravid Elfreda Blanchet, MD 08/28/16 2008

## 2016-08-28 NOTE — ED Notes (Signed)
Pt given crackers peanut butter and ginger ale per Nicole(RN)

## 2016-08-28 NOTE — ED Triage Notes (Signed)
Pt reports abdominal pain. He states we went out drinking Saturday night and then had multiple episodes of vomiting on Sunday and was unable to tolerate anything PO. He states he never vomits after drinking. He states he still feels nauseous and has lack of appetite all week. He reports constant abdominal pain. Pt sent here from urgent care to r/o appendicitis,

## 2016-08-28 NOTE — ED Notes (Signed)
Pt given gingerale and crackers for PO challenge

## 2017-03-31 DIAGNOSIS — M7022 Olecranon bursitis, left elbow: Secondary | ICD-10-CM | POA: Insufficient documentation

## 2018-01-18 DIAGNOSIS — F909 Attention-deficit hyperactivity disorder, unspecified type: Secondary | ICD-10-CM | POA: Diagnosis not present

## 2018-01-18 DIAGNOSIS — F419 Anxiety disorder, unspecified: Secondary | ICD-10-CM | POA: Diagnosis not present

## 2018-01-18 DIAGNOSIS — E7800 Pure hypercholesterolemia, unspecified: Secondary | ICD-10-CM | POA: Diagnosis not present

## 2018-08-09 DIAGNOSIS — F909 Attention-deficit hyperactivity disorder, unspecified type: Secondary | ICD-10-CM | POA: Diagnosis not present

## 2018-08-09 DIAGNOSIS — E7800 Pure hypercholesterolemia, unspecified: Secondary | ICD-10-CM | POA: Diagnosis not present

## 2018-08-09 DIAGNOSIS — J302 Other seasonal allergic rhinitis: Secondary | ICD-10-CM | POA: Diagnosis not present

## 2018-08-09 DIAGNOSIS — F419 Anxiety disorder, unspecified: Secondary | ICD-10-CM | POA: Diagnosis not present

## 2019-02-07 ENCOUNTER — Other Ambulatory Visit: Payer: Self-pay

## 2019-02-07 DIAGNOSIS — Z20822 Contact with and (suspected) exposure to covid-19: Secondary | ICD-10-CM

## 2019-02-07 DIAGNOSIS — R6889 Other general symptoms and signs: Secondary | ICD-10-CM | POA: Diagnosis not present

## 2019-02-09 LAB — NOVEL CORONAVIRUS, NAA: SARS-CoV-2, NAA: NOT DETECTED

## 2019-02-10 DIAGNOSIS — F419 Anxiety disorder, unspecified: Secondary | ICD-10-CM | POA: Diagnosis not present

## 2019-02-10 DIAGNOSIS — F909 Attention-deficit hyperactivity disorder, unspecified type: Secondary | ICD-10-CM | POA: Diagnosis not present

## 2019-02-10 DIAGNOSIS — J302 Other seasonal allergic rhinitis: Secondary | ICD-10-CM | POA: Diagnosis not present

## 2019-02-10 DIAGNOSIS — E7800 Pure hypercholesterolemia, unspecified: Secondary | ICD-10-CM | POA: Diagnosis not present

## 2019-10-04 ENCOUNTER — Other Ambulatory Visit: Payer: Self-pay

## 2019-10-04 ENCOUNTER — Encounter: Payer: Self-pay | Admitting: Family Medicine

## 2019-10-04 ENCOUNTER — Ambulatory Visit (INDEPENDENT_AMBULATORY_CARE_PROVIDER_SITE_OTHER): Payer: 59 | Admitting: Family Medicine

## 2019-10-04 DIAGNOSIS — M545 Low back pain, unspecified: Secondary | ICD-10-CM

## 2019-10-04 MED ORDER — TIZANIDINE HCL 2 MG PO TABS: 2.0000 mg | ORAL_TABLET | Freq: Every evening | ORAL | 1 refills | Status: DC | PRN

## 2019-10-04 MED ORDER — MELOXICAM 15 MG PO TABS: 7.5000 mg | ORAL_TABLET | Freq: Every day | ORAL | 6 refills | Status: DC | PRN

## 2019-10-04 NOTE — Progress Notes (Signed)
I saw and examined the patient with Dr. Robby Sermon and agree with assessment and plan as outlined.    Midline L3-area pain for one month.  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  Exam reveals tight hamstrings, myofascial trigger points in paraspinous muscles.  Will try zanaflex, meloxicam, home stretches and chiropractic Hollice Espy).    X-Rays and MRI if fails to improve.

## 2019-10-04 NOTE — Progress Notes (Signed)
Derek Hoffman - 29 y.o. male MRN [id number]  Date of birth: [birth year]  Office Visit Note: Visit Date: 10/04/2019 PCP: Tally Joe, MD Referred by: Tally Joe, MD  Subjective: Chief Complaint  Patient presents with  . Lower Back - Pain    Progressively worsening pain across lower back x 1 month. Lifts heavy objects daily at work. Unsure of any specific injury. Occ pain down legs (ant/post). No numbness/tingling.   HPI: Derek Hoffman is a 29 y.o. male who comes in today with low back pain for the past month.  He reports that he works in a physically demanding job, lifting heavy tires, Catering manager. Last month, he started to have low back pain the morning after lifting a 300 lb tire. No pain during the lift. Since that time, he has been able to keep working but the pain has been worsening. He has taken some flexeril with relief. Ibuprofen provides mild relief as well. No numbness/tingling or shooting pain down either leg. No weakness, change in bowel or bladder function.   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   ROS Otherwise per HPI.  Assessment & Plan: Visit Diagnoses:  1. Acute low back pain, unspecified back pain laterality, unspecified whether sciatica present     Plan: Low back pain appears to be muscular in nature with several trigger points appreciated along paraspinal muscles. In addition, he has tight hamstrings that are likely exacerbating the pain. He does not think he can make PT work in his schedule currently but would like to try chiropractor. Will give tizanidine and meloxicam. If no improvement, return for imaging to rule out compression fracture.   Meds & Orders:  Meds ordered this encounter  Medications  . tiZANidine (ZANAFLEX) 2 MG tablet    Sig: Take 1-2 tablets (2-4 mg total) by mouth at bedtime as needed for muscle spasms.    Dispense:  60 tablet    Refill:  1  . meloxicam (MOBIC) 15 MG tablet    Sig: Take  0.5-1 tablets (7.5-15 mg total) by mouth daily as needed for pain.    Dispense:  30 tablet    Refill:  6   No orders of the defined types were placed in this encounter.   Follow-up: No follow-ups on file.   Procedures: No procedures performed  No notes on file   Clinical History: No specialty comments available.   He reports that he quit smoking about 9 years ago. His smokeless tobacco use includes snuff. No results for input(s): HGBA1C, LABURIC in the last 8760 hours.  Objective:  VS:  HT:    WT:   BMI:     BP:   HR: bpm  TEMP: ( )  RESP:  Physical Exam  PHYSICAL EXAM: Gen: NAD, alert, cooperative with exam, well-appearing HEENT: clear conjunctiva,  CV:  no edema, capillary refill brisk, normal rate Resp: non-labored Skin: no rashes, normal turgor  Neuro: no gross deficits.  Psych:  alert and oriented  Ortho Exam  Lumbar spine: - Inspection: no gross deformity or asymmetry, swelling or ecchymosis - Palpation: TTP over the L3-L4 paraspinal muscles with trigger points appreciated, no TTP over SI joints b/l - ROM: full active ROM of the lumbar spine in flexion and extension.Mild pain with flexion - Strength: 5/5 strength of lower extremity in L4-S1 nerve root distributions b/l; normal gait - Neuro: sensation intact in the L4-S1 nerve root distribution b/l, 2+  L4 and S1 reflexes - Special testing: Negative straight leg raise, negative slump, negative Stork test, Negative FABER  Hamstrings very tight- go to 45 degrees with passive stretching  Imaging: No results found.  Past Medical/Family/Surgical/Social History: Medications & Allergies reviewed per EMR, new medications updated. Patient Active Problem List   Diagnosis Date Noted  . Olecranon bursitis of left elbow 03/31/2017  . Strain of left quadriceps 09/03/2015  . Other specified postprocedural states 05/28/2015  . Post-traumatic osteoarthritis of left knee 03/20/2015  . Chronic pain of left knee 03/06/2015  .  General medical examination 07/10/2011  . Overweight(278.02) 12/26/2010  . Anxiety 12/18/2010   Past Medical History:  Diagnosis Date  . Anxiety   . 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 History of MRSA infection 2015   left arm  . Lateral meniscus tear 12/2014   left   Family History  Problem Relation Age of Onset  . Hypertension Father    Past Surgical History:  Procedure Laterality Date  . CHONDROPLASTY Left 01/11/2015   Procedure: CHONDROPLASTY;  Surgeon: Ninetta Lights, MD;  Location: [place] [facility];  Service: Orthopedics;  Laterality: Left;  . FEMUR HARDWARE REMOVAL Left 10/28/2007  . KNEE ARTHROSCOPY Left 10/28/2007   with manipulation  . KNEE ARTHROSCOPY WITH EXCISION PLICA Left 02/09/7901   Procedure: KNEE ARTHROSCOPY WITH EXCISION PLICA;  Surgeon: Ninetta Lights, MD;  Location: [place];  Service: Orthopedics;  Laterality: Left;  . KNEE ARTHROSCOPY WITH LATERAL MENISECTOMY Left 01/11/2015   Procedure: LEFT KNEE ARTHROSCOPY  WITH LATERAL MENISCECTOMY, EXCISION OF MEDIAL PLICA, CHONDROPLASTY;  Surgeon: Ninetta Lights, MD;  Location: [place] [facility];  Service: Orthopedics;  Laterality: Left;  . ORIF FEMUR FRACTURE Left   . SEPTOPLASTY    . TONSILLECTOMY AND ADENOIDECTOMY    . WISDOM TOOTH EXTRACTION     Social History   Occupational History  . Not on file  Tobacco Use  . Smoking status: Former Smoker    Quit date: 06/08/2010    Years since quitting: 9.3  . Smokeless tobacco: Current User    Types: Snuff  Substance and Sexual Activity  . Alcohol use: Yes    Comment: occasionally  . Drug use: No  . Sexual activity: Not on file

## 2020-09-05 ENCOUNTER — Other Ambulatory Visit: Payer: Self-pay | Admitting: Otolaryngology

## 2020-10-01 ENCOUNTER — Other Ambulatory Visit: Payer: Self-pay

## 2020-10-01 ENCOUNTER — Encounter (HOSPITAL_BASED_OUTPATIENT_CLINIC_OR_DEPARTMENT_OTHER): Payer: Self-pay | Admitting: Otolaryngology

## 2020-10-04 ENCOUNTER — Other Ambulatory Visit (HOSPITAL_COMMUNITY): Payer: 59

## 2020-10-05 ENCOUNTER — Other Ambulatory Visit (HOSPITAL_COMMUNITY)
Admission: RE | Admit: 2020-10-05 | Discharge: 2020-10-05 | Disposition: A | Discharge status: Home / Self Care | Payer: 59 | Source: Ambulatory Visit | Attending: Otolaryngology | Admitting: Otolaryngology

## 2020-10-05 DIAGNOSIS — Z01812 Encounter for preprocedural laboratory examination: Secondary | ICD-10-CM | POA: Insufficient documentation

## 2020-10-05 DIAGNOSIS — Z20822 Contact with and (suspected) exposure to covid-19: Secondary | ICD-10-CM | POA: Insufficient documentation

## 2020-10-05 NOTE — Progress Notes (Signed)
Sent message reminding pt to go for covid testing today before 3pm. 

## 2020-10-06 LAB — SARS CORONAVIRUS 2 (TAT 6-24 HRS): SARS Coronavirus 2: NEGATIVE

## 2020-10-08 ENCOUNTER — Ambulatory Visit (HOSPITAL_BASED_OUTPATIENT_CLINIC_OR_DEPARTMENT_OTHER)
Admission: RE | Admit: 2020-10-08 | Discharge: 2020-10-08 | Disposition: A | Discharge status: Home / Self Care | Payer: 59 | Attending: Otolaryngology | Admitting: Otolaryngology

## 2020-10-08 ENCOUNTER — Ambulatory Visit (HOSPITAL_BASED_OUTPATIENT_CLINIC_OR_DEPARTMENT_OTHER): Payer: 59 | Admitting: Anesthesiology

## 2020-10-08 ENCOUNTER — Other Ambulatory Visit: Payer: Self-pay

## 2020-10-08 ENCOUNTER — Encounter (HOSPITAL_BASED_OUTPATIENT_CLINIC_OR_DEPARTMENT_OTHER)
Admission: RE | Disposition: A | Discharge status: Home / Self Care | Payer: Self-pay | Source: Home / Self Care | Attending: Otolaryngology

## 2020-10-08 ENCOUNTER — Encounter (HOSPITAL_BASED_OUTPATIENT_CLINIC_OR_DEPARTMENT_OTHER): Payer: Self-pay | Admitting: Otolaryngology

## 2020-10-08 DIAGNOSIS — J343 Hypertrophy of nasal turbinates: Secondary | ICD-10-CM | POA: Insufficient documentation

## 2020-10-08 DIAGNOSIS — J342 Deviated nasal septum: Secondary | ICD-10-CM | POA: Diagnosis not present

## 2020-10-08 DIAGNOSIS — J310 Chronic rhinitis: Secondary | ICD-10-CM | POA: Insufficient documentation

## 2020-10-08 DIAGNOSIS — Z87891 Personal history of nicotine dependence: Secondary | ICD-10-CM | POA: Insufficient documentation

## 2020-10-08 DIAGNOSIS — J3489 Other specified disorders of nose and nasal sinuses: Secondary | ICD-10-CM | POA: Diagnosis not present

## 2020-10-08 HISTORY — PX: NASAL SEPTOPLASTY W/ TURBINOPLASTY: SHX2070

## 2020-10-08 SURGERY — SEPTOPLASTY, NOSE, WITH NASAL TURBINATE REDUCTION
Anesthesia: General | Site: Nose | Laterality: Bilateral

## 2020-10-08 MED ORDER — FENTANYL CITRATE (PF) 100 MCG/2ML IJ SOLN
INTRAMUSCULAR | Status: DC | PRN
  Administered 2020-10-08: 100 ug via INTRAVENOUS

## 2020-10-08 MED ORDER — HYDROMORPHONE HCL 1 MG/ML IJ SOLN
0.2500 mg | INTRAMUSCULAR | Status: DC | PRN
  Administered 2020-10-08 (×2): 0.25 mg via INTRAVENOUS

## 2020-10-08 MED ORDER — ONDANSETRON HCL 4 MG/2ML IJ SOLN
INTRAMUSCULAR | Status: DC | PRN
  Administered 2020-10-08: 4 mg via INTRAVENOUS

## 2020-10-08 MED ORDER — DEXAMETHASONE SODIUM PHOSPHATE 10 MG/ML IJ SOLN
INTRAMUSCULAR | Status: AC
  Filled 2020-10-08: qty 1

## 2020-10-08 MED ORDER — CEFAZOLIN SODIUM-DEXTROSE 2-3 GM-%(50ML) IV SOLR
INTRAVENOUS | Status: DC | PRN
  Administered 2020-10-08: 2 g via INTRAVENOUS

## 2020-10-08 MED ORDER — OXYMETAZOLINE HCL 0.05 % NA SOLN
NASAL | Status: DC | PRN
  Administered 2020-10-08: 1 via TOPICAL

## 2020-10-08 MED ORDER — OXYCODONE HCL 5 MG/5ML PO SOLN: 5.0000 mg | Freq: Once | ORAL | Status: DC | PRN

## 2020-10-08 MED ORDER — CEFAZOLIN SODIUM-DEXTROSE 2-4 GM/100ML-% IV SOLN
INTRAVENOUS | Status: AC
  Filled 2020-10-08: qty 100

## 2020-10-08 MED ORDER — PROPOFOL 10 MG/ML IV BOLUS
INTRAVENOUS | Status: DC | PRN
  Administered 2020-10-08: 200 mg via INTRAVENOUS

## 2020-10-08 MED ORDER — AMOXICILLIN 875 MG PO TABS: 875.0000 mg | ORAL_TABLET | Freq: Two times a day (BID) | ORAL | 0 refills | Status: AC

## 2020-10-08 MED ORDER — OXYCODONE HCL 5 MG PO TABS: 5.0000 mg | ORAL_TABLET | Freq: Once | ORAL | Status: DC | PRN

## 2020-10-08 MED ORDER — LIDOCAINE-EPINEPHRINE 1 %-1:100000 IJ SOLN
INTRAMUSCULAR | Status: DC | PRN
  Administered 2020-10-08: 3.5 mL

## 2020-10-08 MED ORDER — MEPERIDINE HCL 25 MG/ML IJ SOLN: 6.2500 mg | INTRAMUSCULAR | Status: DC | PRN

## 2020-10-08 MED ORDER — MIDAZOLAM HCL 2 MG/2ML IJ SOLN
INTRAMUSCULAR | Status: AC
  Filled 2020-10-08: qty 2

## 2020-10-08 MED ORDER — ONDANSETRON HCL 4 MG/2ML IJ SOLN
INTRAMUSCULAR | Status: AC
  Filled 2020-10-08: qty 2

## 2020-10-08 MED ORDER — ROCURONIUM BROMIDE 100 MG/10ML IV SOLN
INTRAVENOUS | Status: DC | PRN
  Administered 2020-10-08: 100 mg via INTRAVENOUS

## 2020-10-08 MED ORDER — AMISULPRIDE (ANTIEMETIC) 5 MG/2ML IV SOLN: 10.0000 mg | Freq: Once | INTRAVENOUS | Status: DC | PRN

## 2020-10-08 MED ORDER — LIDOCAINE 2% (20 MG/ML) 5 ML SYRINGE
INTRAMUSCULAR | Status: AC
  Filled 2020-10-08: qty 5

## 2020-10-08 MED ORDER — DEXAMETHASONE SODIUM PHOSPHATE 4 MG/ML IJ SOLN
INTRAMUSCULAR | Status: DC | PRN
  Administered 2020-10-08: 10 mg via INTRAVENOUS

## 2020-10-08 MED ORDER — HYDROMORPHONE HCL 1 MG/ML IJ SOLN
INTRAMUSCULAR | Status: AC
  Filled 2020-10-08: qty 0.5

## 2020-10-08 MED ORDER — PROMETHAZINE HCL 25 MG/ML IJ SOLN: 6.2500 mg | INTRAMUSCULAR | Status: DC | PRN

## 2020-10-08 MED ORDER — MUPIROCIN 2 % EX OINT
TOPICAL_OINTMENT | CUTANEOUS | Status: DC | PRN
  Administered 2020-10-08: 1 via TOPICAL

## 2020-10-08 MED ORDER — FENTANYL CITRATE (PF) 100 MCG/2ML IJ SOLN
INTRAMUSCULAR | Status: AC
  Filled 2020-10-08: qty 2

## 2020-10-08 MED ORDER — MIDAZOLAM HCL 5 MG/5ML IJ SOLN
INTRAMUSCULAR | Status: DC | PRN
  Administered 2020-10-08: 2 mg via INTRAVENOUS

## 2020-10-08 MED ORDER — LIDOCAINE HCL (CARDIAC) PF 100 MG/5ML IV SOSY
PREFILLED_SYRINGE | INTRAVENOUS | Status: DC | PRN
  Administered 2020-10-08: 100 mg via INTRAVENOUS

## 2020-10-08 MED ORDER — LACTATED RINGERS IV SOLN: INTRAVENOUS | Status: DC

## 2020-10-08 MED ORDER — SUGAMMADEX SODIUM 500 MG/5ML IV SOLN
INTRAVENOUS | Status: DC | PRN
  Administered 2020-10-08: 400 mg via INTRAVENOUS

## 2020-10-08 MED ORDER — OXYCODONE-ACETAMINOPHEN 5-325 MG PO TABS: 1.0000 | ORAL_TABLET | ORAL | 0 refills | Status: AC | PRN

## 2020-10-08 SURGICAL SUPPLY — 32 items
ATTRACTOMAT 16X20 MAGNETIC DRP (DRAPES) IMPLANT
CANISTER SUCT 1200ML W/VALVE (MISCELLANEOUS) ×2 IMPLANT
COAGULATOR SUCT 8FR VV (MISCELLANEOUS) ×2 IMPLANT
COVER WAND RF STERILE (DRAPES) IMPLANT
DECANTER SPIKE VIAL GLASS SM (MISCELLANEOUS) IMPLANT
DRSG NASOPORE 8CM (GAUZE/BANDAGES/DRESSINGS) IMPLANT
DRSG TELFA 3X8 NADH (GAUZE/BANDAGES/DRESSINGS) IMPLANT
ELECT REM PT RETURN 9FT ADLT (ELECTROSURGICAL) ×2
ELECTRODE REM PT RTRN 9FT ADLT (ELECTROSURGICAL) ×1 IMPLANT
GLOVE SURG ENC MOIS LTX SZ7.5 (GLOVE) ×2 IMPLANT
GLOVE SURG POLYISO LF SZ8 (GLOVE) ×2 IMPLANT
GOWN STRL REUS W/ TWL LRG LVL3 (GOWN DISPOSABLE) ×1 IMPLANT
GOWN STRL REUS W/TWL 2XL LVL3 (GOWN DISPOSABLE) ×2 IMPLANT
GOWN STRL REUS W/TWL LRG LVL3 (GOWN DISPOSABLE) ×2
NEEDLE HYPO 25X1 1.5 SAFETY (NEEDLE) ×2 IMPLANT
NS IRRIG 1000ML POUR BTL (IV SOLUTION) ×2 IMPLANT
PACK BASIN DAY SURGERY FS (CUSTOM PROCEDURE TRAY) ×2 IMPLANT
PACK ENT DAY SURGERY (CUSTOM PROCEDURE TRAY) ×2 IMPLANT
SLEEVE SCD COMPRESS KNEE MED (STOCKING) IMPLANT
SOLUTION BUTLER CLEAR DIP (MISCELLANEOUS) ×2 IMPLANT
SPLINT NASAL AIRWAY SILICONE (MISCELLANEOUS) ×2 IMPLANT
SPONGE GAUZE 2X2 8PLY STRL LF (GAUZE/BANDAGES/DRESSINGS) ×2 IMPLANT
SPONGE NEURO XRAY DETECT 1X3 (DISPOSABLE) ×2 IMPLANT
SUT CHROMIC 4 0 P 3 18 (SUTURE) ×2 IMPLANT
SUT PLAIN 4 0 ~~LOC~~ 1 (SUTURE) ×2 IMPLANT
SUT PROLENE 3 0 PS 2 (SUTURE) ×2 IMPLANT
SUT VIC AB 4-0 P-3 18XBRD (SUTURE) IMPLANT
SUT VIC AB 4-0 P3 18 (SUTURE)
TOWEL GREEN STERILE FF (TOWEL DISPOSABLE) ×2 IMPLANT
TUBE SALEM SUMP 12R W/ARV (TUBING) IMPLANT
TUBE SALEM SUMP 16 FR W/ARV (TUBING) ×2 IMPLANT
YANKAUER SUCT BULB TIP NO VENT (SUCTIONS) ×2 IMPLANT

## 2020-10-08 NOTE — Anesthesia Procedure Notes (Signed)
Procedure Name: Intubation Performed by: Lance Coon, CRNA Pre-anesthesia Checklist: Patient identified, Emergency Drugs available, Suction available and Patient being monitored Patient Re-evaluated:Patient Re-evaluated prior to induction Oxygen Delivery Method: Circle system utilized Preoxygenation: Pre-oxygenation with 100% oxygen Induction Type: IV induction Ventilation: Mask ventilation without difficulty Laryngoscope Size: Miller and 5 Grade View: Grade I Tube type: Oral Rae Tube size: 7.0 mm Number of attempts: 1 Airway Equipment and Method: Stylet and Oral airway Placement Confirmation: ETT inserted through vocal cords under direct vision,  positive ETCO2 and breath sounds checked- equal and bilateral Tube secured with: Tape Dental Injury: Teeth and Oropharynx as per pre-operative assessment

## 2020-10-08 NOTE — Discharge Instructions (Addendum)

## 2020-10-08 NOTE — Op Note (Signed)
DATE OF PROCEDURE: 10/08/2020  OPERATIVE REPORT   SURGEON: Newman Pies, MD   PREOPERATIVE DIAGNOSES:  1. Severe nasal septal deviation.  2. Bilateral inferior turbinate hypertrophy.  3. Chronic nasal obstruction.  POSTOPERATIVE DIAGNOSES:  1. Severe nasal septal deviation.  2. Bilateral inferior turbinate hypertrophy.  3. Chronic nasal obstruction.  PROCEDURE PERFORMED:  1. Revision septoplasty.  2. Bilateral partial inferior turbinate resection.   ANESTHESIA: General endotracheal tube anesthesia.   COMPLICATIONS: None.   ESTIMATED BLOOD LOSS: 125 mL.   INDICATION FOR PROCEDURE: Derek Hoffman is a 30 y.o. male with a history of chronic nasal obstruction. He underwent septoplasty 6 years ago with initial improvement in his obstruction. However, the patient has noted progressive obstruction over the past few years. On examination, he was noted to have bilateral septal spurs with posterior septal perforations from his previous septoplasty. He was also noted to have bilateral inferior turbinate hypertrophy. The patient was placed on daily Flonase without improvement in his symptoms. Based on the above findings, the decision was made for the patient to undergo the above-stated procedures. The risks, benefits, alternatives, and details of the procedures were discussed with the patient. Questions were invited and answered. Informed consent was obtained.   DESCRIPTION OF PROCEDURE: The patient was taken to the operating room and placed supine on the operating table. General endotracheal tube anesthesia was administered by the anesthesiologist. The patient was positioned, and prepped and draped in the standard fashion for nasal surgery. Pledgets soaked with Afrin were placed in both nasal cavities for decongestion. The pledgets were subsequently removed.   Examination of the nasal cavity revealed a severe nasal septal deviation. 1% lidocaine with 1:100,000 epinephrine was injected onto the nasal  septum bilaterally. A hemitransfixion incision was made on the left side. The mucosal flap was carefully elevated on the left side. A cartilaginous incision was made 1 cm superior to the caudal margin of the nasal septum. Mucosal flap was also elevated on the right side in the similar fashion. It should be noted that due to the severe septal deviation, the deviated portion of the cartilaginous and bony septum had to be removed in piecemeal fashion. Once the deviated portions were removed, a straight midline septum was achieved. The septum was then quilted with 4-0 plain gut sutures. The hemitransfixion incision was closed with interrupted 4-0 chromic sutures.   The inferior one half of both hypertrophied inferior turbinate was crossclamped with a Kelly clamp. The inferior one half of each inferior turbinate was then resected with a pair of cross cutting scissors. Hemostasis was achieved with a suction cautery device.  Doyle splints were applied to the nasal septum.  The care of the patient was turned over to the anesthesiologist. The patient was awakened from anesthesia without difficulty. The patient was extubated and transferred to the recovery room in good condition.   OPERATIVE FINDINGS: Nasal septal deviation and bilateral inferior turbinate hypertrophy.   SPECIMEN: None.   FOLLOWUP CARE: The patient be discharged home once he is awake and alert. The patient will be placed on Percocet 1 tablets p.o. q.4 hours p.r.n. pain, and amoxicillin 875 mg p.o. b.i.d. for 3 days. The patient will follow up in my office in 3 days for splint removal.   Madysyn Hanken Philomena Doheny, MD

## 2020-10-08 NOTE — Anesthesia Postprocedure Evaluation (Signed)
Anesthesia Post Note  Patient: Derek Hoffman  Procedure(s) Performed: REVISION NASAL SEPTOPLASTY WITH TURBINATE REDUCTION AND SEPTUM REVISION (Bilateral Nose)     Patient location during evaluation: PACU Anesthesia Type: General Level of consciousness: awake and alert Pain management: pain level controlled Vital Signs Assessment: post-procedure vital signs reviewed and stable Respiratory status: spontaneous breathing, nonlabored ventilation and respiratory function stable Cardiovascular status: blood pressure returned to baseline and stable Postop Assessment: no apparent nausea or vomiting Anesthetic complications: no   No complications documented.  Last Vitals:  Vitals:   10/08/20 1145 10/08/20 1213  BP: 132/84 (!) 149/90  Pulse: 76 70  Resp: 15 20  Temp:  37.1 C  SpO2: 100% 100%    Last Pain:  Vitals:   10/08/20 1213  TempSrc: Oral  PainSc: 3                  Lowella Curb

## 2020-10-08 NOTE — Transfer of Care (Signed)
Immediate Anesthesia Transfer of Care Note  Patient: Derek Hoffman  Procedure(s) Performed: REVISION NASAL SEPTOPLASTY WITH TURBINATE REDUCTION AND SEPTUM REVISION (Bilateral Nose)  Patient Location: PACU  Anesthesia Type:General  Level of Consciousness: awake  Airway & Oxygen Therapy: Patient Spontanous Breathing and Patient connected to face mask oxygen  Post-op Assessment: Report given to RN and Post -op Vital signs reviewed and stable  Post vital signs: Reviewed and stable  Last Vitals:  Vitals Value Taken Time  BP    Temp    Pulse    Resp    SpO2      Last Pain:  Vitals:   10/08/20 0828  TempSrc: Oral  PainSc: 0-No pain      Patients Stated Pain Goal: 4 (10/08/20 0828)  Complications: No complications documented.

## 2020-10-08 NOTE — Anesthesia Preprocedure Evaluation (Signed)
Anesthesia Evaluation  Patient identified by MRN, date of birth, ID band Patient awake    Reviewed: Allergy & Precautions, NPO status , Patient's Chart, lab work & pertinent test results  Airway Mallampati: I  TM Distance: >3 FB Neck ROM: Full    Dental no notable dental hx.    Pulmonary former smoker,    Pulmonary exam normal breath sounds clear to auscultation       Cardiovascular negative cardio ROS Normal cardiovascular exam Rhythm:Regular Rate:Normal     Neuro/Psych Anxiety    GI/Hepatic negative GI ROS, Neg liver ROS,   Endo/Other    Renal/GU negative Renal ROS     Musculoskeletal  (+) Arthritis , Osteoarthritis,    Abdominal (+) + obese,   Peds  Hematology   Anesthesia Other Findings   Reproductive/Obstetrics                             Anesthesia Physical  Anesthesia Plan  ASA: II  Anesthesia Plan: General   Post-op Pain Management:    Induction: Intravenous  PONV Risk Score and Plan: 2 and Ondansetron, Midazolam and Treatment may vary due to age or medical condition  Airway Management Planned: Oral ETT  Additional Equipment:   Intra-op Plan:   Post-operative Plan: Extubation in OR  Informed Consent: I have reviewed the patients History and Physical, chart, labs and discussed the procedure including the risks, benefits and alternatives for the proposed anesthesia with the patient or authorized representative who has indicated his/her understanding and acceptance.     Dental advisory given  Plan Discussed with: CRNA and Anesthesiologist  Anesthesia Plan Comments:         Anesthesia Quick Evaluation

## 2020-10-08 NOTE — H&P (Addendum)
Cc: Chronic nasal obstruction  HPI: The patient is a 30 y/o male who returns today for follow up evaluation of chronic nasal obstruction. The patient was last seen 6 weeks. 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 He was also noted to have bilateral inferior turbinate hypertrophy. The patient was placed on daily Flonase. He returns today noting no improvement in his nasal breathing. He also notes chronic facial pressure.    Exam: General: Communicates without difficulty, well nourished, no acute distress. Head: Normocephalic, no evidence injury, no tenderness, facial buttresses intact without stepoff. Eyes: PERRL, EOMI. No scleral icterus, conjunctivae clear. Neuro: CN II exam reveals vision grossly intact.  No nystagmus at any point of gaze. Ears: Auricles well formed without lesions.  Ear canals are intact without mass or lesion.  No erythema or edema is appreciated.  The TMs are intact without fluid. Nose: External evaluation reveals normal support and skin without lesions.  Dorsum is intact.  Anterior rhinoscopy reveals congested and edematous mucosa over anterior aspect of the inferior turbinates and nasal septum.  No purulence is noted. Middle meatus is not well visualized. Oral:  Oral cavity and oropharynx are intact, symmetric, without erythema or edema.  Mucosa is moist without lesions. Neck: Full range of motion without pain.  There is no significant lymphadenopathy.  No masses palpable.  Thyroid bed within normal limits to palpation.  Parotid glands and submandibular glands equal bilaterally without mass.  Trachea is midline. Neuro:  CN 2-12 grossly intact. Gait normal. Vestibular: No nystagmus at any point of gaze.   The flexible scope was inserted into the right nasal cavity. Anterior septal spur with posterior septal perforations. Endoscopy of the interior nasal cavity, superior, inferior, and middle meatus  was performed. The sphenoid-ethmoid recess was examined. Edematous mucosa was noted. No polyp, mass, or lesion was appreciated. Olfactory cleft was clear. Nasopharynx was clear. Turbinates were hypertrophied but without mass. The procedure was repeated on the contralateral side with an obstructing septal spur. The patient tolerated the procedure well.   Assessment: 1. Chronic rhinitis without evidence of acute sinusitis. No purulent drainage, polyps, or other suspicious mass or lesion is noted on today's nasal endoscopy. 2. 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 Several small posterior septal perforations are noted from his previous septoplasty.  3. Bilateral inferior turbinate hypertrophy.    Plan:   1. Nasal endoscopy findings are reviewed with the patient.  2. Treatment options are reviewed, including conservative management with daily steroid nasal spray versus revision septoplasty and bilateral inferior turbinate reduction. The risks, benefits, details, and alternatives of the procedure are discussed with the patient. Questions are invited and answered. 3. The patient would like to proceed with surgical intervention.

## 2020-10-09 ENCOUNTER — Encounter (HOSPITAL_BASED_OUTPATIENT_CLINIC_OR_DEPARTMENT_OTHER): Payer: Self-pay | Admitting: Otolaryngology

## 2020-12-20 ENCOUNTER — Emergency Department (HOSPITAL_COMMUNITY): Payer: 59

## 2020-12-20 ENCOUNTER — Emergency Department (HOSPITAL_COMMUNITY)
Admission: EM | Admit: 2020-12-20 | Discharge: 2020-12-21 | Disposition: A | Discharge status: Home / Self Care | Payer: 59 | Attending: Emergency Medicine | Admitting: Emergency Medicine

## 2020-12-20 ENCOUNTER — Encounter (HOSPITAL_COMMUNITY): Payer: Self-pay

## 2020-12-20 DIAGNOSIS — S80812A Abrasion, left lower leg, initial encounter: Secondary | ICD-10-CM | POA: Diagnosis not present

## 2020-12-20 DIAGNOSIS — S0512XA Contusion of eyeball and orbital tissues, left eye, initial encounter: Secondary | ICD-10-CM | POA: Diagnosis not present

## 2020-12-20 DIAGNOSIS — S40811A Abrasion of right upper arm, initial encounter: Secondary | ICD-10-CM | POA: Insufficient documentation

## 2020-12-20 DIAGNOSIS — S301XXA Contusion of abdominal wall, initial encounter: Secondary | ICD-10-CM | POA: Diagnosis not present

## 2020-12-20 DIAGNOSIS — T148XXA Other injury of unspecified body region, initial encounter: Secondary | ICD-10-CM

## 2020-12-20 DIAGNOSIS — Y92410 Unspecified street and highway as the place of occurrence of the external cause: Secondary | ICD-10-CM | POA: Insufficient documentation

## 2020-12-20 DIAGNOSIS — S80811A Abrasion, right lower leg, initial encounter: Secondary | ICD-10-CM | POA: Insufficient documentation

## 2020-12-20 DIAGNOSIS — S060X0A Concussion without loss of consciousness, initial encounter: Secondary | ICD-10-CM | POA: Insufficient documentation

## 2020-12-20 DIAGNOSIS — S40812A Abrasion of left upper arm, initial encounter: Secondary | ICD-10-CM | POA: Insufficient documentation

## 2020-12-20 DIAGNOSIS — S0990XA Unspecified injury of head, initial encounter: Secondary | ICD-10-CM | POA: Diagnosis present

## 2020-12-20 DIAGNOSIS — S299XXA Unspecified injury of thorax, initial encounter: Secondary | ICD-10-CM | POA: Insufficient documentation

## 2020-12-20 LAB — COMPREHENSIVE METABOLIC PANEL
ALT: 39 U/L (ref 0–44)
AST: 35 U/L (ref 15–41)
Albumin: 4.3 g/dL (ref 3.5–5.0)
Alkaline Phosphatase: 74 U/L (ref 38–126)
Anion gap: 10 (ref 5–15)
BUN: 11 mg/dL (ref 6–20)
CO2: 22 mmol/L (ref 22–32)
Calcium: 9.4 mg/dL (ref 8.9–10.3)
Chloride: 102 mmol/L (ref 98–111)
Creatinine, Ser: 1.12 mg/dL (ref 0.61–1.24)
GFR, Estimated: >60 mL/min (ref >60)
Glucose, Bld: 123 mg/dL — ABNORMAL HIGH (ref 70–99)
Potassium: 2.8 mmol/L — ABNORMAL LOW (ref 3.5–5.1)
Sodium: 134 mmol/L — ABNORMAL LOW (ref 135–145)
Total Bilirubin: 0.7 mg/dL (ref 0.3–1.2)
Total Protein: 7.2 g/dL (ref 6.5–8.1)

## 2020-12-20 LAB — I-STAT CHEM 8, ED
BUN: 13 mg/dL (ref 6–20)
Calcium, Ion: 1.14 mmol/L — ABNORMAL LOW (ref 1.15–1.40)
Chloride: 102 mmol/L (ref 98–111)
Creatinine, Ser: 1.1 mg/dL (ref 0.61–1.24)
Glucose, Bld: 123 mg/dL — ABNORMAL HIGH (ref 70–99)
HCT: 47 % (ref 39.0–52.0)
Hemoglobin: 16 g/dL (ref 13.0–17.0)
Potassium: 3 mmol/L — ABNORMAL LOW (ref 3.5–5.1)
Sodium: 138 mmol/L (ref 135–145)
TCO2: 24 mmol/L (ref 22–32)

## 2020-12-20 LAB — CBC
HCT: 47.1 % (ref 39.0–52.0)
Hemoglobin: 15.8 g/dL (ref 13.0–17.0)
MCH: 28.6 pg (ref 26.0–34.0)
MCHC: 33.5 g/dL (ref 30.0–36.0)
MCV: 85.3 fL (ref 80.0–100.0)
Platelets: 227 10*3/uL (ref 150–400)
RBC: 5.52 MIL/uL (ref 4.22–5.81)
RDW: 12.7 % (ref 11.5–15.5)
WBC: 8.6 10*3/uL (ref 4.0–10.5)
nRBC: 0 % (ref 0.0–0.2)

## 2020-12-20 LAB — PROTIME-INR
INR: 1 (ref 0.8–1.2)
Prothrombin Time: 13.4 seconds (ref 11.4–15.2)

## 2020-12-20 LAB — LACTIC ACID, PLASMA: Lactic Acid, Venous: 2.6 mmol/L (ref 0.5–1.9)

## 2020-12-20 LAB — ETHANOL: Alcohol, Ethyl (B): <10 mg/dL (ref <10)

## 2020-12-20 LAB — SAMPLE TO BLOOD BANK

## 2020-12-20 MED ORDER — IOHEXOL 300 MG/ML  SOLN
100.0000 mL | Freq: Once | INTRAMUSCULAR | Status: AC | PRN
  Administered 2020-12-20: 100 mL via INTRAVENOUS

## 2020-12-20 MED ORDER — POTASSIUM CHLORIDE 10 MEQ/100ML IV SOLN
10.0000 meq | INTRAVENOUS | Status: AC
  Administered 2020-12-20 (×2): 10 meq via INTRAVENOUS
  Filled 2020-12-20 (×2): qty 100

## 2020-12-20 MED ORDER — FENTANYL CITRATE (PF) 100 MCG/2ML IJ SOLN: 50.0000 ug | Freq: Once | INTRAMUSCULAR | Status: AC

## 2020-12-20 MED ORDER — LACTATED RINGERS IV BOLUS
1000.0000 mL | Freq: Once | INTRAVENOUS | Status: AC
  Administered 2020-12-20: 1000 mL via INTRAVENOUS

## 2020-12-20 MED ORDER — ONDANSETRON HCL 4 MG/2ML IJ SOLN
4.0000 mg | Freq: Once | INTRAMUSCULAR | Status: AC
  Administered 2020-12-20: 4 mg via INTRAVENOUS
  Filled 2020-12-20: qty 2

## 2020-12-20 MED ORDER — FENTANYL CITRATE (PF) 100 MCG/2ML IJ SOLN
INTRAMUSCULAR | Status: AC
  Administered 2020-12-20: 50 ug via INTRAVENOUS
  Filled 2020-12-20: qty 2
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
## 2020-12-20 NOTE — ED Provider Notes (Signed)
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 consciousness, initial encounter    Rx / DC Orders ED Discharge Orders     None        Colvin Caroli, MD 12/21/20 1610    Tilden Fossa, MD 12/24/20 1754

## 2020-12-21 ENCOUNTER — Encounter (HOSPITAL_BASED_OUTPATIENT_CLINIC_OR_DEPARTMENT_OTHER): Payer: Self-pay | Admitting: Otolaryngology

## 2020-12-21 LAB — URINALYSIS, ROUTINE W REFLEX MICROSCOPIC
Bilirubin Urine: NEGATIVE
Glucose, UA: NEGATIVE mg/dL
Hgb urine dipstick: NEGATIVE
Ketones, ur: NEGATIVE mg/dL
Leukocytes,Ua: NEGATIVE
Nitrite: NEGATIVE
Protein, ur: NEGATIVE mg/dL
Specific Gravity, Urine: 1.019 (ref 1.005–1.030)
pH: 6 (ref 5.0–8.0)

## 2020-12-21 NOTE — ED Notes (Signed)
Pt ambulated with a steady gait.
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
## 2021-01-21 ENCOUNTER — Other Ambulatory Visit: Payer: Self-pay

## 2021-01-21 ENCOUNTER — Encounter: Payer: Self-pay | Admitting: Neurology

## 2021-01-21 ENCOUNTER — Telehealth: Payer: Self-pay | Admitting: Neurology

## 2021-01-21 ENCOUNTER — Ambulatory Visit (INDEPENDENT_AMBULATORY_CARE_PROVIDER_SITE_OTHER): Payer: 59 | Admitting: Neurology

## 2021-01-21 VITALS — BP 128/90 | HR 79 | Ht 70.0 in | Wt 276.8 lb

## 2021-01-21 DIAGNOSIS — H539 Unspecified visual disturbance: Secondary | ICD-10-CM

## 2021-01-21 DIAGNOSIS — R42 Dizziness and giddiness: Secondary | ICD-10-CM | POA: Diagnosis not present

## 2021-01-21 DIAGNOSIS — F0781 Postconcussional syndrome: Secondary | ICD-10-CM

## 2021-01-21 DIAGNOSIS — S0990XA Unspecified injury of head, initial encounter: Secondary | ICD-10-CM

## 2021-01-21 DIAGNOSIS — H5460 Unqualified visual loss, one eye, unspecified: Secondary | ICD-10-CM

## 2021-01-21 DIAGNOSIS — G44319 Acute post-traumatic headache, not intractable: Secondary | ICD-10-CM

## 2021-01-21 DIAGNOSIS — R4189 Other symptoms and signs involving cognitive functions and awareness: Secondary | ICD-10-CM

## 2021-01-21 MED ORDER — MECLIZINE HCL 25 MG PO TABS: 25.0000 mg | ORAL_TABLET | Freq: Three times a day (TID) | ORAL | 0 refills | Status: DC | PRN
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
## 2021-01-21 NOTE — Telephone Encounter (Signed)
Referral faxed to [facility]. Marked as urgent, requesting they call patient to schedule ASAP. Phone: [phone].

## 2021-01-22 ENCOUNTER — Telehealth: Payer: Self-pay | Admitting: Neurology

## 2021-01-22 NOTE — Telephone Encounter (Signed)
Referral sent to [facility]. Phone: [phone]. 

## 2021-01-22 NOTE — Telephone Encounter (Signed)
Megan from [facility]  called wanting to inform us that they do not accept [facility] and that we need to refer this pt somewhere else.

## 2021-01-23 ENCOUNTER — Telehealth: Payer: Self-pay | Admitting: Neurology

## 2021-01-23 NOTE — Telephone Encounter (Signed)
[facility] auth: [id number] (exp. 01/23/21 to 02/21/21) order sent to [facility] because [facility] is not in network for the MRI and [facility] is.

## 2021-02-03 ENCOUNTER — Ambulatory Visit
Admission: RE | Admit: 2021-02-03 | Discharge: 2021-02-03 | Disposition: A | Discharge status: Home / Self Care | Payer: 59 | Source: Ambulatory Visit | Attending: Neurology | Admitting: Neurology

## 2021-02-03 ENCOUNTER — Other Ambulatory Visit: Payer: Self-pay

## 2021-02-03 DIAGNOSIS — G44319 Acute post-traumatic headache, not intractable: Secondary | ICD-10-CM

## 2021-02-03 DIAGNOSIS — S0990XA Unspecified injury of head, initial encounter: Secondary | ICD-10-CM

## 2021-02-03 DIAGNOSIS — R4189 Other symptoms and signs involving cognitive functions and awareness: Secondary | ICD-10-CM

## 2021-02-03 DIAGNOSIS — R42 Dizziness and giddiness: Secondary | ICD-10-CM

## 2021-02-03 DIAGNOSIS — H539 Unspecified visual disturbance: Secondary | ICD-10-CM

## 2021-02-03 DIAGNOSIS — H5460 Unqualified visual loss, one eye, unspecified: Secondary | ICD-10-CM

## 2021-02-03 MED ORDER — GADOBENATE DIMEGLUMINE 529 MG/ML IV SOLN
20.0000 mL | Freq: Once | INTRAVENOUS | Status: AC | PRN
  Administered 2021-02-03: 20 mL via INTRAVENOUS

## 2021-02-05 NOTE — Telephone Encounter (Signed)
Pt called would like for someone to go over his MRI results with him. Pt requesting a call back.

## 2021-02-05 NOTE — Telephone Encounter (Signed)
I called patient back I relayed that his MRI was normal which is great.  He had seen his ophthalmologist second time and eye exam was better.  And that he has a follow-up appointment in 2 months with them, if things worsen will follow-up sooner if needed.   He works Physicist, medical.  He is still receiving physical therapy for his neck and shoulder.  He asked about if he needed to continue on his decreased hours.  And per Dr. Lucia Gaskins it was reallyup to him on how he was feeling, as everyone heals at their own rate.  He verbalized understanding will call back as needed.

## 2021-02-25 ENCOUNTER — Telehealth: Payer: Self-pay | Admitting: Neurology

## 2021-02-25 NOTE — Telephone Encounter (Signed)
Pt called states his symptoms are not improving. Pt requesting a call back. 
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
## 2021-02-26 ENCOUNTER — Other Ambulatory Visit: Payer: Self-pay | Admitting: Neurology

## 2021-02-26 MED ORDER — AMITRIPTYLINE HCL 25 MG PO TABS: ORAL_TABLET | ORAL | 3 refills | Status: DC

## 2021-06-12 DIAGNOSIS — M7542 Impingement syndrome of left shoulder: Secondary | ICD-10-CM | POA: Diagnosis not present

## 2021-06-18 NOTE — Progress Notes (Signed)
Sent message, via epic in basket, requesting orders in epic from surgeon.  

## 2021-06-20 NOTE — Patient Instructions (Addendum)
DUE TO COVID-19 ONLY ONE VISITOR IS ALLOWED TO COME WITH YOU AND STAY IN THE WAITING ROOM ONLY DURING PRE OP AND PROCEDURE.   **NO VISITORS ARE ALLOWED IN THE SHORT STAY AREA OR RECOVERY ROOM!!**  IF YOU WILL BE ADMITTED INTO THE HOSPITAL YOU ARE ALLOWED ONLY TWO SUPPORT PEOPLE DURING VISITATION HOURS ONLY (7 AM -8PM)   The support person(s) must pass our screening, gel in and out, and wear a mask at all times, including in the patients room. Patients must also wear a mask when staff or their support person are in the room. Visitors GUEST BADGE MUST BE WORN VISIBLY  One adult visitor may remain with you overnight and MUST be in the room by 8 P.M.  No visitors under the age of 18. Any visitor under the age of 40 must be accompanied by an adult.        Your procedure is scheduled on: 06/27/21   Report to [facility] Main Entrance    Report to admitting at: 10:15  AM   Call this number if you have problems the morning of surgery [phone]   Do not eat food :After Midnight.   May have liquids until : 9:30 AM   day of surgery  CLEAR LIQUID DIET  Foods Allowed                                                                     Foods Excluded  Water, Black Coffee and tea, regular and decaf                             liquids that you cannot  Plain Jell-O in any flavor  (No red)                                           see through such as: Fruit ices (not with fruit pulp)                                     milk, soups, orange juice              Iced Popsicles (No red)                                    All solid food                                   Apple juices Sports drinks like Gatorade (No red) Lightly seasoned clear broth or consume(fat free) Sugar  Sample Menu Breakfast                                Lunch  Supper Cranberry juice                    Beef broth                            Chicken broth Jell-O                                      Grape juice                           Apple juice Coffee or tea                        Jell-O                                      Popsicle                                               Coffee or tea                        Coffee or tea    Complete one Ensure drink the morning of surgery 3 hours prior to scheduled surgery at: 9:30 AM.    The day of surgery:  Drink ONE (1) Pre-Surgery Clear Ensure or G2 at AM the morning of surgery. Drink in one sitting. Do not sip.  This drink was given to you during your hospital  pre-op appointment visit. Nothing else to drink after completing the  Pre-Surgery Clear Ensure or G2.          If you have questions, please contact your surgeons office.    Oral Hygiene is also important to reduce your risk of infection.                                    Remember - BRUSH YOUR TEETH THE MORNING OF SURGERY WITH YOUR REGULAR TOOTHPASTE   Do NOT smoke after Midnight   Take these medicines the morning of surgery with A SIP OF WATER: celexa  DO NOT TAKE ANY ORAL DIABETIC MEDICATIONS DAY OF YOUR SURGERY                              You may not have any metal on your body including hair pins, jewelry, and body piercing             Do not wear lotions, powders, perfumes/cologne, or deodorant               Men may shave face and neck.   Do not bring valuables to the hospital. [facility] IS NOT             RESPONSIBLE   FOR VALUABLES.   Contacts, dentures or bridgework may not be worn into surgery.   Bring small overnight bag day of surgery.    I am requesting pre-op orders in epic for day of  surgery: Pre op appointment is on:  Thank you, Rockwell Alexandria, BSN, RN    Special Instructions: Bring a copy of your healthcare power of attorney and living will documents         the day of surgery if you haven't scanned them before.              Please read over the following fact sheets you were given: IF YOU HAVE QUESTIONS ABOUT YOUR PRE-OP  INSTRUCTIONS PLEASE CALL [phone]     [facility] - Preparing for Surgery Before surgery, you can play an important role.  Because skin is not sterile, your skin needs to be as free of germs as possible.  You can reduce the number of germs on your skin by washing with CHG (chlorahexidine gluconate) soap before surgery.  CHG is an antiseptic cleaner which kills germs and bonds with the skin to continue killing germs even after washing. Please DO NOT use if you have an allergy to CHG or antibacterial soaps.  If your skin becomes reddened/irritated stop using the CHG and inform your nurse when you arrive at Short Stay. Do not shave (including legs and underarms) for at least 48 hours prior to the first CHG shower.  You may shave your face/neck. Please follow these instructions carefully:  1.  Shower with CHG Soap the night before surgery and the  morning of Surgery.  2.  If you choose to wash your hair, wash your hair first as usual with your  normal  shampoo.  3.  After you shampoo, rinse your hair and body thoroughly to remove the  shampoo.                           4.  Use CHG as you would any other liquid soap.  You can apply chg directly  to the skin and wash                       Gently with a scrungie or clean washcloth.  5.  Apply the CHG Soap to your body ONLY FROM THE NECK DOWN.   Do not use on face/ open                           Wound or open sores. Avoid contact with eyes, ears mouth and genitals (private parts).                       Wash face,  Genitals (private parts) with your normal soap.             6.  Wash thoroughly, paying special attention to the area where your surgery  will be performed.  7.  Thoroughly rinse your body with warm water from the neck down.  8.  DO NOT shower/wash with your normal soap after using and rinsing off  the CHG Soap.                9.  Pat yourself dry with a clean towel.            10.  Wear clean pajamas.            11.  Place clean sheets on  your bed the night of your first shower and do not  sleep with pets. Day of Surgery : Do not apply any lotions/deodorants the morning of surgery.  Please wear clean clothes to the hospital/surgery center.  FAILURE TO FOLLOW THESE INSTRUCTIONS MAY RESULT IN THE CANCELLATION OF YOUR SURGERY PATIENT SIGNATURE_________________________________  NURSE SIGNATURE__________________________________  ________________________________________________________________________ [facility]    Before surgery, you can play an important role. Because skin is not sterile, your skin needs to be as free of germs as possible. You can reduce the number of germs on your skin by using the following products. Benzoyl Peroxide Gel Reduces the number of germs present on the skin Applied twice a day to shoulder area starting two days before surgery    ==================================================================  Please follow these instructions carefully:  BENZOYL PEROXIDE 5% GEL  Please do not use if you have an allergy to benzoyl peroxide.   If your skin becomes reddened/irritated stop using the benzoyl peroxide.  Starting two days before surgery, apply as follows: Apply benzoyl peroxide in the morning and at night. Apply after taking a shower. If you are not taking a shower clean entire shoulder front, back, and side along with the armpit with a clean wet washcloth.  Place a quarter-sized dollop on your shoulder and rub in thoroughly, making sure to cover the front, back, and side of your shoulder, along with the armpit.   2 days before ____ AM   ____ PM              1 day before ____ AM   ____ PM                         Do this twice a day for two days.  (Last application is the night before surgery, AFTER using the CHG soap as described below).  Do NOT apply benzoyl peroxide gel on the day of surgery.   Incentive Spirometer  An incentive spirometer is a  tool that can help keep your lungs clear and active. This tool measures how well you are filling your lungs with each breath. Taking long deep breaths may help reverse or decrease the chance of developing breathing (pulmonary) problems (especially infection) following: A long period of time when you are unable to move or be active. BEFORE THE PROCEDURE  If the spirometer includes an indicator to show your best effort, your nurse or respiratory therapist will set it to a desired goal. If possible, sit up straight or lean slightly forward. Try not to slouch. Hold the incentive spirometer in an upright position. INSTRUCTIONS FOR USE  Sit on the edge of your bed if possible, or sit up as far as you can in bed or on a chair. Hold the incentive spirometer in an upright position. Breathe out normally. Place the mouthpiece in your mouth and seal your lips tightly around it. Breathe in slowly and as deeply as possible, raising the piston or the ball toward the top of the column. Hold your breath for 3-5 seconds or for as long as possible. Allow the piston or ball to fall to the bottom of the column. Remove the mouthpiece from your mouth and breathe out normally. Rest for a few seconds and repeat Steps 1 through 7 at least 10 times every 1-2 hours when you are awake. Take your time and take a few normal breaths between deep breaths. The spirometer may include an indicator to show your best effort. Use the indicator as a goal to work toward during each repetition. After each set of 10 deep breaths, practice coughing to be sure your  lungs are clear. If you have an incision (the cut made at the time of surgery), support your incision when coughing by placing a pillow or rolled up towels firmly against it. Once you are able to get out of bed, walk around indoors and cough well. You may stop using the incentive spirometer when instructed by your caregiver.  RISKS AND COMPLICATIONS Take your time so you do not  get dizzy or light-headed. If you are in pain, you may need to take or ask for pain medication before doing incentive spirometry. It is harder to take a deep breath if you are having pain. AFTER USE Rest and breathe slowly and easily. It can be helpful to keep track of a log of your progress. Your caregiver can provide you with a simple table to help with this. If you are using the spirometer at home, follow these instructions: SEEK MEDICAL CARE IF:  You are having difficultly using the spirometer. You have trouble using the spirometer as often as instructed. Your pain medication is not giving enough relief while using the spirometer. You develop fever of 100.5 F (38.1 C) or higher. SEEK IMMEDIATE MEDICAL CARE IF:  You cough up bloody sputum that had not been present before. You develop fever of 102 F (38.9 C) or greater. You develop worsening pain at or near the incision site. MAKE SURE YOU:  Understand these instructions. Will watch your condition. Will get help right away if you are not doing well or get worse. Document Released: 10/06/2006 Document Revised: 08/18/2011 Document Reviewed: 12/07/2006 [facility] Patient Information 2014 [employer], [place].    Patients discharged on the day of surgery will not be allowed to drive home.  Someone needs to stay with you for the first 24 hours after anesthesia.

## 2021-06-21 ENCOUNTER — Other Ambulatory Visit: Payer: Self-pay

## 2021-06-21 ENCOUNTER — Encounter (HOSPITAL_COMMUNITY)
Admission: RE | Admit: 2021-06-21 | Discharge: 2021-06-21 | Disposition: A | Discharge status: Home / Self Care | Payer: 59 | Source: Ambulatory Visit | Attending: Orthopedic Surgery | Admitting: Orthopedic Surgery

## 2021-06-21 ENCOUNTER — Encounter (HOSPITAL_COMMUNITY): Payer: Self-pay

## 2021-06-21 VITALS — BP 128/103 | HR 77 | Temp 98.2°F | Ht 71.0 in | Wt 281.0 lb

## 2021-06-21 DIAGNOSIS — Z01812 Encounter for preprocedural laboratory examination: Secondary | ICD-10-CM | POA: Diagnosis not present

## 2021-06-21 DIAGNOSIS — Z01818 Encounter for other preprocedural examination: Secondary | ICD-10-CM

## 2021-06-21 HISTORY — DX: Depression, unspecified: F32.A

## 2021-06-21 LAB — CBC
HCT: 48.2 % (ref 39.0–52.0)
Hemoglobin: 16.2 g/dL (ref 13.0–17.0)
MCH: 28.3 pg (ref 26.0–34.0)
MCHC: 33.6 g/dL (ref 30.0–36.0)
MCV: 84.3 fL (ref 80.0–100.0)
Platelets: 210 10*3/uL (ref 150–400)
RBC: 5.72 MIL/uL (ref 4.22–5.81)
RDW: 12.8 % (ref 11.5–15.5)
WBC: 7.8 10*3/uL (ref 4.0–10.5)
nRBC: 0 % (ref 0.0–0.2)

## 2021-06-21 LAB — SURGICAL PCR SCREEN
MRSA, PCR: NEGATIVE
Staphylococcus aureus: POSITIVE — AB

## 2021-06-21 NOTE — Progress Notes (Signed)
PCR: + STAPH °

## 2021-06-21 NOTE — Progress Notes (Signed)
COVID Vaccine Completed: Yes Date COVID Vaccine completed: 2021 x 2 COVID vaccine manufacturer: Pfizer    COVID Test: N/A PCP - Dr. Antony Contras Cardiologist -   Chest x-ray - 12/20/20. CT chest: 01/20/21 EKG -  Stress Test -  ECHO -  Cardiac Cath -  Pacemaker/ICD device last checked:  Sleep Study -  CPAP -   Fasting Blood Sugar -  Checks Blood Sugar _____ times a day  Blood Thinner Instructions: Aspirin Instructions: Last Dose:  Anesthesia review:   Patient denies shortness of breath, fever, cough and chest pain at PAT appointment   Patient verbalized understanding of instructions that were given to them at the PAT appointment. Patient was also instructed that they will need to review over the PAT instructions again at home before surgery.

## 2021-06-26 NOTE — Anesthesia Preprocedure Evaluation (Addendum)
Anesthesia Evaluation  Patient identified by MRN, date of birth, ID band Patient awake    Reviewed: Allergy & Precautions, NPO status , Patient's Chart, lab work & pertinent test results  Airway Mallampati: II  TM Distance: >3 FB Neck ROM: Full    Dental no notable dental hx. (+) Teeth Intact, Dental Advisory Given   Pulmonary former smoker,    Pulmonary exam normal breath sounds clear to auscultation       Cardiovascular Exercise Tolerance: Good Normal cardiovascular exam Rhythm:Regular Rate:Normal     Neuro/Psych PSYCHIATRIC DISORDERS Anxiety Depression  Neuromuscular disease    GI/Hepatic negative GI ROS,   Endo/Other  negative endocrine ROS  Renal/GU negative Renal ROS     Musculoskeletal  (+) Arthritis ,   Abdominal (+) + obese (BMI 39.19),   Peds  Hematology   Anesthesia Other Findings NKA  Reproductive/Obstetrics                            Anesthesia Physical Anesthesia Plan  ASA: 2  Anesthesia Plan: General and Regional   Post-op Pain Management: Regional block   Induction: Intravenous  PONV Risk Score and Plan: 3 and Treatment may vary due to age or medical condition, Midazolam and Ondansetron  Airway Management Planned: Oral ETT  Additional Equipment: None  Intra-op Plan:   Post-operative Plan: Extubation in OR  Informed Consent:     Dental advisory given  Plan Discussed with:   Anesthesia Plan Comments: (GA w L ISB w Exparel)       Anesthesia Quick Evaluation

## 2021-06-27 ENCOUNTER — Encounter (HOSPITAL_COMMUNITY): Payer: Self-pay | Admitting: Orthopedic Surgery

## 2021-06-27 ENCOUNTER — Encounter (HOSPITAL_COMMUNITY)
Admission: RE | Disposition: A | Discharge status: Home / Self Care | Payer: Self-pay | Source: Ambulatory Visit | Attending: Orthopedic Surgery

## 2021-06-27 ENCOUNTER — Ambulatory Visit (HOSPITAL_COMMUNITY)
Admission: RE | Admit: 2021-06-27 | Discharge: 2021-06-27 | Disposition: A | Discharge status: Home / Self Care | Payer: 59 | Source: Ambulatory Visit | Attending: Orthopedic Surgery | Admitting: Orthopedic Surgery

## 2021-06-27 ENCOUNTER — Ambulatory Visit (HOSPITAL_COMMUNITY): Payer: 59 | Admitting: Anesthesiology

## 2021-06-27 DIAGNOSIS — Z87891 Personal history of nicotine dependence: Secondary | ICD-10-CM | POA: Diagnosis not present

## 2021-06-27 DIAGNOSIS — M7502 Adhesive capsulitis of left shoulder: Secondary | ICD-10-CM | POA: Insufficient documentation

## 2021-06-27 DIAGNOSIS — M19012 Primary osteoarthritis, left shoulder: Secondary | ICD-10-CM | POA: Insufficient documentation

## 2021-06-27 DIAGNOSIS — R69 Illness, unspecified: Secondary | ICD-10-CM | POA: Diagnosis not present

## 2021-06-27 DIAGNOSIS — F32A Depression, unspecified: Secondary | ICD-10-CM | POA: Diagnosis not present

## 2021-06-27 DIAGNOSIS — M7542 Impingement syndrome of left shoulder: Secondary | ICD-10-CM

## 2021-06-27 DIAGNOSIS — M75112 Incomplete rotator cuff tear or rupture of left shoulder, not specified as traumatic: Secondary | ICD-10-CM | POA: Insufficient documentation

## 2021-06-27 DIAGNOSIS — Z6839 Body mass index (BMI) 39.0-39.9, adult: Secondary | ICD-10-CM | POA: Insufficient documentation

## 2021-06-27 DIAGNOSIS — G8918 Other acute postprocedural pain: Secondary | ICD-10-CM | POA: Diagnosis not present

## 2021-06-27 HISTORY — PX: SHOULDER ARTHROSCOPY WITH ROTATOR CUFF REPAIR: SHX5685

## 2021-06-27 SURGERY — ARTHROSCOPY, SHOULDER, WITH ROTATOR CUFF REPAIR
Anesthesia: Regional | Laterality: Left

## 2021-06-27 MED ORDER — OXYCODONE HCL 5 MG/5ML PO SOLN: 5.0000 mg | Freq: Once | ORAL | Status: DC | PRN

## 2021-06-27 MED ORDER — CEFAZOLIN IN SODIUM CHLORIDE 3-0.9 GM/100ML-% IV SOLN
3.0000 g | INTRAVENOUS | Status: AC
  Administered 2021-06-27: 3 g via INTRAVENOUS
  Filled 2021-06-27: qty 100

## 2021-06-27 MED ORDER — FENTANYL CITRATE (PF) 100 MCG/2ML IJ SOLN
INTRAMUSCULAR | Status: DC | PRN
Start: 2021-06-27 — End: 2021-06-27
  Administered 2021-06-27: 100 ug via INTRAVENOUS

## 2021-06-27 MED ORDER — LIDOCAINE HCL (PF) 2 % IJ SOLN
INTRAMUSCULAR | Status: AC
  Filled 2021-06-27: qty 5

## 2021-06-27 MED ORDER — ROCURONIUM BROMIDE 10 MG/ML (PF) SYRINGE
PREFILLED_SYRINGE | INTRAVENOUS | Status: DC | PRN
  Administered 2021-06-27: 60 mg via INTRAVENOUS

## 2021-06-27 MED ORDER — FENTANYL CITRATE (PF) 100 MCG/2ML IJ SOLN
INTRAMUSCULAR | Status: AC
  Filled 2021-06-27: qty 2

## 2021-06-27 MED ORDER — CYCLOBENZAPRINE HCL 10 MG PO TABS: 10.0000 mg | ORAL_TABLET | Freq: Three times a day (TID) | ORAL | 1 refills | Status: DC | PRN

## 2021-06-27 MED ORDER — CHLORHEXIDINE GLUCONATE 0.12 % MT SOLN
15.0000 mL | Freq: Once | OROMUCOSAL | Status: AC
  Administered 2021-06-27: 15 mL via OROMUCOSAL

## 2021-06-27 MED ORDER — SUGAMMADEX SODIUM 500 MG/5ML IV SOLN
INTRAVENOUS | Status: DC | PRN
  Administered 2021-06-27: 260 mg via INTRAVENOUS

## 2021-06-27 MED ORDER — NAPROXEN 500 MG PO TABS: 500.0000 mg | ORAL_TABLET | Freq: Two times a day (BID) | ORAL | 1 refills | Status: DC

## 2021-06-27 MED ORDER — PROPOFOL 10 MG/ML IV BOLUS
INTRAVENOUS | Status: AC
  Filled 2021-06-27: qty 20

## 2021-06-27 MED ORDER — BUPIVACAINE HCL (PF) 0.5 % IJ SOLN
INTRAMUSCULAR | Status: DC | PRN
  Administered 2021-06-27: 15 mL via PERINEURAL

## 2021-06-27 MED ORDER — HYDROMORPHONE HCL 1 MG/ML IJ SOLN: 0.2500 mg | INTRAMUSCULAR | Status: DC | PRN

## 2021-06-27 MED ORDER — OXYCODONE-ACETAMINOPHEN 5-325 MG PO TABS: 1.0000 | ORAL_TABLET | ORAL | 0 refills | Status: DC | PRN

## 2021-06-27 MED ORDER — ACETAMINOPHEN 10 MG/ML IV SOLN: 1000.0000 mg | Freq: Once | INTRAVENOUS | Status: DC | PRN

## 2021-06-27 MED ORDER — SUCCINYLCHOLINE CHLORIDE 200 MG/10ML IV SOSY
PREFILLED_SYRINGE | INTRAVENOUS | Status: DC | PRN
  Administered 2021-06-27: 120 mg via INTRAVENOUS

## 2021-06-27 MED ORDER — BUPIVACAINE LIPOSOME 1.3 % IJ SUSP
INTRAMUSCULAR | Status: DC | PRN
Start: 2021-06-27 — End: 2021-06-27
  Administered 2021-06-27: 10 mL via PERINEURAL

## 2021-06-27 MED ORDER — DEXAMETHASONE SODIUM PHOSPHATE 4 MG/ML IJ SOLN
INTRAMUSCULAR | Status: DC | PRN
  Administered 2021-06-27: 8 mg via INTRAVENOUS

## 2021-06-27 MED ORDER — DEXAMETHASONE SODIUM PHOSPHATE 10 MG/ML IJ SOLN
INTRAMUSCULAR | Status: AC
  Filled 2021-06-27: qty 1

## 2021-06-27 MED ORDER — AMISULPRIDE (ANTIEMETIC) 5 MG/2ML IV SOLN: 10.0000 mg | Freq: Once | INTRAVENOUS | Status: DC | PRN

## 2021-06-27 MED ORDER — ONDANSETRON HCL 4 MG PO TABS: 4.0000 mg | ORAL_TABLET | Freq: Three times a day (TID) | ORAL | 0 refills | Status: DC | PRN

## 2021-06-27 MED ORDER — MIDAZOLAM HCL 2 MG/2ML IJ SOLN
1.0000 mg | INTRAMUSCULAR | Status: DC
  Administered 2021-06-27: 2 mg via INTRAVENOUS
  Filled 2021-06-27: qty 2

## 2021-06-27 MED ORDER — ONDANSETRON HCL 4 MG/2ML IJ SOLN: 4.0000 mg | Freq: Once | INTRAMUSCULAR | Status: DC | PRN

## 2021-06-27 MED ORDER — ORAL CARE MOUTH RINSE: 15.0000 mL | Freq: Once | OROMUCOSAL | Status: AC

## 2021-06-27 MED ORDER — SUGAMMADEX SODIUM 500 MG/5ML IV SOLN
INTRAVENOUS | Status: AC
  Filled 2021-06-27: qty 5

## 2021-06-27 MED ORDER — LACTATED RINGERS IV SOLN: INTRAVENOUS | Status: DC

## 2021-06-27 MED ORDER — SODIUM CHLORIDE 0.9 % IR SOLN
Status: DC | PRN
  Administered 2021-06-27: 9000 mL

## 2021-06-27 MED ORDER — ONDANSETRON HCL 4 MG/2ML IJ SOLN
INTRAMUSCULAR | Status: DC | PRN
  Administered 2021-06-27: 4 mg via INTRAVENOUS

## 2021-06-27 MED ORDER — FENTANYL CITRATE PF 50 MCG/ML IJ SOSY
50.0000 ug | PREFILLED_SYRINGE | INTRAMUSCULAR | Status: DC
  Administered 2021-06-27: 100 ug via INTRAVENOUS
  Filled 2021-06-27: qty 2

## 2021-06-27 MED ORDER — ONDANSETRON HCL 4 MG/2ML IJ SOLN
INTRAMUSCULAR | Status: AC
  Filled 2021-06-27: qty 2

## 2021-06-27 MED ORDER — PROPOFOL 10 MG/ML IV BOLUS
INTRAVENOUS | Status: DC | PRN
  Administered 2021-06-27: 200 mg via INTRAVENOUS

## 2021-06-27 MED ORDER — DEXMEDETOMIDINE (PRECEDEX) IN NS 20 MCG/5ML (4 MCG/ML) IV SYRINGE
PREFILLED_SYRINGE | INTRAVENOUS | Status: DC | PRN
  Administered 2021-06-27: 12 ug via INTRAVENOUS

## 2021-06-27 MED ORDER — LIDOCAINE 2% (20 MG/ML) 5 ML SYRINGE
INTRAMUSCULAR | Status: DC | PRN
  Administered 2021-06-27: 100 mg via INTRAVENOUS

## 2021-06-27 MED ORDER — OXYCODONE HCL 5 MG PO TABS: 5.0000 mg | ORAL_TABLET | Freq: Once | ORAL | Status: DC | PRN

## 2021-06-27 SURGICAL SUPPLY — 55 items
BAG COUNTER SPONGE SURGICOUNT (BAG) ×2 IMPLANT
BLADE EXCALIBUR 4.0X13 (MISCELLANEOUS) ×2 IMPLANT
BOOTIES KNEE HIGH SLOAN (MISCELLANEOUS) ×4 IMPLANT
BURR OVAL 8 FLU 4.0X13 (MISCELLANEOUS) ×2 IMPLANT
CANNULA ACUFLEX KIT 5X76 (CANNULA) ×2 IMPLANT
CANNULA DRILOCK 5.0X75 (CANNULA) ×1 IMPLANT
CANNULA TWIST IN 8.25X7CM (CANNULA) IMPLANT
CONNECTOR 5 IN 1 STRAIGHT STRL (MISCELLANEOUS) ×2 IMPLANT
COOLER ICEMAN CLASSIC (MISCELLANEOUS) IMPLANT
DISSECTOR  3.8MM X 13CM (MISCELLANEOUS) ×2
DISSECTOR 3.8MM X 13CM (MISCELLANEOUS) ×1 IMPLANT
DRAPE INCISE 23X17 IOBAN STRL (DRAPES) ×1
DRAPE INCISE 23X17 STRL (DRAPES) ×1 IMPLANT
DRAPE INCISE IOBAN 23X17 STRL (DRAPES) ×1 IMPLANT
DRAPE INCISE IOBAN 66X45 STRL (DRAPES) ×2 IMPLANT
DRAPE ORTHO SPLIT 77X108 STRL (DRAPES)
DRAPE STERI 35X30 U-POUCH (DRAPES) ×2 IMPLANT
DRAPE SURG 17X11 SM STRL (DRAPES) ×2 IMPLANT
DRAPE SURG ORHT 6 SPLT 77X108 (DRAPES) IMPLANT
DRAPE U-SHAPE 47X51 STRL (DRAPES) ×2 IMPLANT
DRSG PAD ABDOMINAL 8X10 ST (GAUZE/BANDAGES/DRESSINGS) ×3 IMPLANT
DURAPREP 26ML APPLICATOR (WOUND CARE) ×2 IMPLANT
GAUZE SPONGE 4X4 12PLY STRL (GAUZE/BANDAGES/DRESSINGS) ×2 IMPLANT
GLOVE SURG ENC MOIS LTX SZ7.5 (GLOVE) ×2 IMPLANT
GLOVE SURG ENC MOIS LTX SZ8 (GLOVE) ×2 IMPLANT
GLOVE SURG MICRO LTX SZ7 (GLOVE) ×4 IMPLANT
GLOVE SURG MICRO LTX SZ7.5 (GLOVE) ×2 IMPLANT
GOWN STRL REUS W/TWL LRG LVL3 (GOWN DISPOSABLE) ×4 IMPLANT
KIT BASIN OR (CUSTOM PROCEDURE TRAY) ×2 IMPLANT
KIT SHOULDER TRACTION (DRAPES) ×2 IMPLANT
KIT TURNOVER KIT A (KITS) IMPLANT
MANIFOLD NEPTUNE II (INSTRUMENTS) ×2 IMPLANT
NDL SCORPION MULTI FIRE (NEEDLE) IMPLANT
NEEDLE SCORPION MULTI FIRE (NEEDLE) IMPLANT
NS IRRIG 1000ML POUR BTL (IV SOLUTION) ×2 IMPLANT
PACK ARTHROSCOPY WL (CUSTOM PROCEDURE TRAY) ×2 IMPLANT
PAD ARMBOARD 7.5X6 YLW CONV (MISCELLANEOUS) ×2 IMPLANT
PAD COLD SHLDR WRAP-ON (PAD) IMPLANT
PROBE APOLLO 90XL (SURGICAL WAND) ×2 IMPLANT
SLING ARM FOAM STRAP MED (SOFTGOODS) IMPLANT
SLING ARM FOAM STRAP XLG (SOFTGOODS) ×1 IMPLANT
SPONGE T-LAP 4X18 ~~LOC~~+RFID (SPONGE) ×2 IMPLANT
STRIP CLOSURE SKIN 1/2X4 (GAUZE/BANDAGES/DRESSINGS) ×2 IMPLANT
SUT FIBERWIRE #2 38 T-5 BLUE (SUTURE)
SUT MNCRL AB 3-0 PS2 18 (SUTURE) ×2 IMPLANT
SUT PDS AB 0 CT 36 (SUTURE) IMPLANT
SUT TIGER TAPE 7 IN WHITE (SUTURE) ×2 IMPLANT
SUTURE FIBERWR #2 38 T-5 BLUE (SUTURE) IMPLANT
TAPE FIBER 2MM 7IN #2 BLUE (SUTURE) IMPLANT
TAPE PAPER 3X10 WHT MICROPORE (GAUZE/BANDAGES/DRESSINGS) ×2 IMPLANT
TAPE STRIPS DRAPE STRL (GAUZE/BANDAGES/DRESSINGS) ×1 IMPLANT
TOWEL OR 17X26 10 PK STRL BLUE (TOWEL DISPOSABLE) ×2 IMPLANT
TUBING ARTHROSCOPY IRRIG 16FT (MISCELLANEOUS) ×2 IMPLANT
WATER STERILE IRR 1000ML POUR (IV SOLUTION) ×2 IMPLANT
YANKAUER SUCT BULB TIP 10FT TU (MISCELLANEOUS) ×2 IMPLANT

## 2021-06-27 NOTE — Discharge Instructions (Signed)
   Kevin M. Supple, M.D., F.A.A.O.S. Orthopaedic Surgery Specializing in Arthroscopic and Reconstructive Surgery of the Shoulder [phone] [address] - [place], [place] [address] - Fax [phone]   POST-OP SHOULDER ARTHROSCOPY INSTRUCTIONS  1. Call the office at [phone] to schedule your first post-op appointment 7-10 days from the date of your surgery.  2. Leave the steri-strips in place over your incisions when performing dressing changes and showering. You may remove your dressings and begin showering 72 hours from surgery. You can expect drainage that is clear to bloody in nature that occasionally will soak through your dressings. If this occurs go ahead and perform a dressing change. The drainage should lessen daily and when there is no drainage from your incisions feel free to go without a dressing.  3. Wear your sling for comfort. You may come out of your sling for ad lib activity and even decide not to use the sling at all. If you find you are more comfortable in your sling, make sure you come out of your sling at least 3-4 times a day to do the exercises that are included below.  4. Range of motion to your elbow, wrist, and hand are encouraged 3-5 times daily. Exercise to your hand and fingers helps to reduce swelling you may experience.  5. Utilize ice to the shoulder 3-4 times minimum a day and additionally if you are experiencing pain.  6. You may drive when safely off narcotics and muscle relaxants.  7.Pain control following an exparel block  To help control your post-operative pain you received a nerve block  performed with Exparel which is a long acting anesthetic (numbing agent) which can provide pain relief and sensations of numbness (and relief of pain) in the operative shoulder and arm for up to 3 days. Sometimes it provides mixed relief, meaning you may still have numbness in certain areas of the arm but can still be able to move  parts of that arm,  hand, and fingers. We recommend that your prescribed pain medications  be used as needed. We do not feel it is necessary to "pre medicate" and "stay ahead" of pain.  Taking narcotic pain medications when you are not having any pain can lead to unnecessary and potentially dangerous side effects.    8. Pain medications can produce constipation along with their use. If you experience this, the use of an over the counter stool softener or laxative daily is recommended.   9. For additional questions or concerns, please do not hesitate to call the office. If after hours there is an answering service to forward your concerns to the physician on call.   POST-OP EXERCISES  The pendulum exercises should be performed while bending at the waist as far over as possible thereby letting gravity do the work for you.  Range of Motion Exercises: Pendulum (circular)  Repeat 20 times. Do 3 sessions per day.     Range of Motion Exercises: Pendulum (side-to-side)  Repeat 20 times. Do 3 sessions per day.    Range of Motion Exercises (self-stretching activities):  Slide arm up wall with palm toward you, moving closer to the wall. Hold for 5 seconds.  Repeat 10 times. Do 3 sessions per day.       

## 2021-06-27 NOTE — Transfer of Care (Signed)
Immediate Anesthesia Transfer of Care Note  Patient: Derek Hoffman  Procedure(s) Performed: Left Shoulder arthroscopy, debridement, subacromial decompression, distal clavicle resection, (Left)  Patient Location: PACU  Anesthesia Type:General  Level of Consciousness: awake, alert  and oriented  Airway & Oxygen Therapy: Patient Spontanous Breathing and Patient connected to face mask oxygen  Post-op Assessment: Report given to RN and Post -op Vital signs reviewed and stable  Post vital signs: Reviewed and stable  Last Vitals:  Vitals Value Taken Time  BP 141/101   Temp    Pulse 95 06/27/21 1616  Resp 19 06/27/21 1616  SpO2 99 % 06/27/21 1616  Vitals shown include unvalidated device data.  Last Pain:  Vitals:   06/27/21 1313  TempSrc: Oral  PainSc:          Complications: No notable events documented.

## 2021-06-27 NOTE — Anesthesia Postprocedure Evaluation (Signed)
Anesthesia Post Note  Patient: Marquavious Nazar  Procedure(s) Performed: Left Shoulder arthroscopy, debridement, subacromial decompression, distal clavicle resection, (Left)     Patient location during evaluation: PACU Anesthesia Type: Regional and General Level of consciousness: awake and alert Pain management: pain level controlled Vital Signs Assessment: post-procedure vital signs reviewed and stable Respiratory status: spontaneous breathing, nonlabored ventilation, respiratory function stable and patient connected to nasal cannula oxygen Cardiovascular status: blood pressure returned to baseline and stable Postop Assessment: no apparent nausea or vomiting Anesthetic complications: no   No notable events documented.  Last Vitals:  Vitals:   06/27/21 1645 06/27/21 1650  BP: 126/84 133/77  Pulse: 80 81  Resp: 18 16  Temp:    SpO2: 95% 93%    Last Pain:  Vitals:   06/27/21 1650  TempSrc:   PainSc: 3                  Trevor Iha

## 2021-06-27 NOTE — Progress Notes (Signed)
AssistedDr. Houser with left, ultrasound guided, interscalene  block. Side rails up, monitors on throughout procedure. See vital signs in flow sheet. Tolerated Procedure well.  

## 2021-06-27 NOTE — Op Note (Signed)
06/27/2021  4:03 PM  PATIENT:   Derek Hoffman  31 y.o. male  PRE-OPERATIVE DIAGNOSIS:  Left shoulder impingement, acromioclavicular osteoarthritis, partial rotator cuff tear  POST-OPERATIVE DIAGNOSIS: Left shoulder impingement syndrome and symptomatic AC joint arthritis and subacromial/subdeltoid bursitis  PROCEDURE:  1.  Left shoulder examination under anesthesia.  2.  Left shoulder glenohumeral joint diagnostic arthroscopy  3.  Arthroscopic subacromial decompression and bursectomy.   4.  Arthroscopic distal clavicle resection.  SURGEON:  Senaida Lange M.D.  ASSISTANTS: Ralene Bathe, PA-C  ANESTHESIA:   General endotracheal and interscalene block with Exparel  EBL: Minimal  SPECIMEN: None  Drains: None   PATIENT DISPOSITION:  PACU - hemodynamically stable.    PLAN OF CARE: Discharge to home after PACU  Brief history:  Patient is a 31 year old male who sustained a left shoulder injury back in July 2022 and has had ongoing pain with likely underlying impingement syndrome.  Recent MRI scan does not show an obvious full-thickness rotator cuff tear but there was evidence for some intrasubstance degeneration.  AC joint arthritis and bony impingement are noted on my review of his MRI.  Due to his ongoing pain and functional imitations and failure to respond to prolonged attempts at conservative management, he is brought to the operating room at this time for planned left shoulder arthroscopy as described below.  Preoperatively the patient was counseled regarding treatment options as well as the potential risks versus benefits thereof.  Possible surgical complications were reviewed including the potential for bleeding, infection, neurovascular injury, persistent pain, loss of motion, anesthetic complication, and possible need for additional surgery.  He understands, and accepts, and agrees with the plan procedure.  Procedure in detail:  After undergoing routine preop  evaluation the patient received prophylactic antibiotics and interscalene block with Exparel was established in the holding area by the anesthesia department.  Patient subsequently placed spine on the operating table and underwent the smooth induction of a general endotracheal anesthesia.  Placed into the right lateral decubitus position on beanbag and appropriate padded and protected.  Left shoulder examination under anesthesia revealed full motion and no obvious instability patterns.  Left arm then suspended at approximately 70 degrees of abduction with 15 pounds of traction and the left shoulder girdle region was sterilely prepped and draped in standard fashion.  Timeout was called.  A posterior portal was established into the glenohumeral joint and diagnostic arthroscopy was performed.  The capsular volume was noted to be within normal limits without evidence for pathologic instability patterns.  No significant synovitis.  The glenoid was carefully inspected circumferentially and was found to be intact.  The articular surfaces were all in pristine condition.  Long head biceps tendon was of normal caliber with no evidence for proximal or distal instability.  Rotator cuff was carefully inspected found to be intact throughout.  No obvious fraying or degeneration.  At this point fluid and instruments were removed from the glenohumeral joint and the arm was then dropped down to 30 degrees of abduction with the arthroscope introduced in the subacromial space of the posterior portal and a direct lateral portal established into the subacromial space.  Abundant dense bursal tissue and multiple adhesions were encountered and these were all divided and excised with combination the shaver and Stryker wand.  The wand was then used to remove the periosteum from the undersurface of the anterior half of the acromion and a subacromial decompression was performed with a bur crating a type I morphology.  A portal  was then  established directly anterior to the distal clavicle and a distal clavicle resection was performed with a bur.  Care was taken to confirm visualization of the entire circumference of the distal clavicle to ensure adequate removal of bone.  We then completed the subacromial/subdeltoid bursectomy.  Bursal surface of the rotator cuff was carefully inspected and found to be intact.  Final hemostasis was obtained.  Fluid and incensed removed.  The portals were closed with Monocryl and Steri-Strip.  A dry dressing taped about the left shoulder left arm is placed into a sling and the patient was awakened, extubated, and taken to the recovery room in stable condition.  Ralene Bathe, PA-C was utilized as an Geophysicist/field seismologist throughout this case essential for help with positioning the patient due to his morbid obesity as well as manipulation of the arthroscopic equipment, wound closure, and intraoperative decision-making.  Senaida Lange MD   Contact # [phone]

## 2021-06-27 NOTE — Anesthesia Procedure Notes (Signed)
Procedure Name: Intubation Date/Time: 06/27/2021 2:47 PM Performed by: Niel Hummer, CRNA Pre-anesthesia Checklist: Patient identified, Emergency Drugs available, Suction available and Patient being monitored Patient Re-evaluated:Patient Re-evaluated prior to induction Oxygen Delivery Method: Circle system utilized Preoxygenation: Pre-oxygenation with 100% oxygen Induction Type: IV induction Laryngoscope Size: Mac and 4 Grade View: Grade I Tube type: Oral Tube size: 7.5 mm Number of attempts: 1 Airway Equipment and Method: Stylet Placement Confirmation: ETT inserted through vocal cords under direct vision, positive ETCO2 and breath sounds checked- equal and bilateral Secured at: 24 cm Tube secured with: Tape Dental Injury: Teeth and Oropharynx as per pre-operative assessment

## 2021-06-27 NOTE — Anesthesia Procedure Notes (Signed)
Anesthesia Regional Block: Interscalene brachial plexus block   Pre-Anesthetic Checklist: , timeout performed,  Correct Patient, Correct Site, Correct Laterality,  Correct Procedure, Correct Position, site marked,  Risks and benefits discussed,  Surgical consent,  Pre-op evaluation,  At surgeon's request and post-op pain management  Laterality: Upper and Left  Prep: Maximum Sterile Barrier Precautions used, chloraprep       Needles:  Injection technique: Single-shot  Needle Type: Echogenic Needle     Needle Length: 5cm  Needle Gauge: 21     Additional Needles:   Procedures:,,,, ultrasound used (permanent image in chart),,    Narrative:  Start time: 06/27/2021 1:46 PM End time: 06/27/2021 1:52 PM Injection made incrementally with aspirations every 5 mL.  Performed by: Personally  Anesthesiologist: Trevor Iha, MD  Additional Notes: Block assessed prior to procedure. Patient tolerated procedure well.
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
## 2021-06-28 ENCOUNTER — Encounter (HOSPITAL_COMMUNITY): Payer: Self-pay | Admitting: Orthopedic Surgery

## 2021-07-18 DIAGNOSIS — M25512 Pain in left shoulder: Secondary | ICD-10-CM | POA: Diagnosis not present

## 2021-08-07 DIAGNOSIS — M25512 Pain in left shoulder: Secondary | ICD-10-CM | POA: Diagnosis not present

## 2021-08-21 ENCOUNTER — Ambulatory Visit: Payer: 59 | Admitting: Neurology

## 2021-08-21 ENCOUNTER — Other Ambulatory Visit: Payer: Self-pay

## 2021-08-21 ENCOUNTER — Encounter: Payer: Self-pay | Admitting: Neurology

## 2021-08-21 VITALS — BP 137/91 | HR 72 | Ht 71.0 in | Wt 280.0 lb

## 2021-08-21 DIAGNOSIS — R6889 Other general symptoms and signs: Secondary | ICD-10-CM | POA: Diagnosis not present

## 2021-08-21 DIAGNOSIS — R42 Dizziness and giddiness: Secondary | ICD-10-CM

## 2021-08-21 DIAGNOSIS — R69 Illness, unspecified: Secondary | ICD-10-CM | POA: Diagnosis not present

## 2021-08-21 DIAGNOSIS — E162 Hypoglycemia, unspecified: Secondary | ICD-10-CM

## 2021-08-21 DIAGNOSIS — R41840 Attention and concentration deficit: Secondary | ICD-10-CM | POA: Diagnosis not present

## 2021-08-21 DIAGNOSIS — G8929 Other chronic pain: Secondary | ICD-10-CM | POA: Diagnosis not present

## 2021-08-21 DIAGNOSIS — F0781 Postconcussional syndrome: Secondary | ICD-10-CM

## 2021-08-21 DIAGNOSIS — F909 Attention-deficit hyperactivity disorder, unspecified type: Secondary | ICD-10-CM

## 2021-08-21 DIAGNOSIS — R519 Headache, unspecified: Secondary | ICD-10-CM
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
## 2021-08-21 NOTE — Patient Instructions (Addendum)
?- Memory and concentration: Has a diagnosis of ADHD untreated Vs long tern effect of concussions.  Referral to [facility]. He had vyvanse in the past but didn't like the way it made him feel.  ? ?- Dizziness: Need thorough medical evaluation, dizziness can be caused by many things. Blood work. Follow up with pcp may need a cardiac evaluation or other evaluations. Vestibular therapy.  ? ?- Headaches: he needs contacts awaiting contacts which could be the cause of the headaches especially at the end of the day. Get the contacts and monitor. If the headaches don't get better may consider propranolol  ? ?- Anxiety: Continue to treat ? ?[facility]: [phone] ? ?Propranolol Extended-Release Capsules ?What is this medication? ?PROPRANOLOL (proe PRAN oh lole) treats many conditions such as high blood pressure and heart disease. It may also be used to prevent chest pain (angina). It works by lowering your blood pressure and heart rate, making it easier for your heart to pump blood to the rest of your body. It can also be used to prevent migraine headaches. It works by relaxing the blood vessels in the brain that cause migraines. It belongs to a group of medications called beta blockers. ?This medicine may be used for other purposes; ask your health care provider or pharmacist if you have questions. ?COMMON BRAND NAME(S): Inderal LA, Inderal XL, InnoPran XL ?What should I tell my care team before I take this medication? ?They need to know if you have any of these conditions: ?Circulation problems, or blood vessel disease ?Diabetes ?History of heart attack or heart disease, vasospastic angina ?Kidney disease ?Liver disease ?Lung or breathing disease, like asthma or emphysema ?Pheochromocytoma ?Slow heart rate ?Thyroid disease ?An unusual or allergic reaction to propranolol, other beta-blockers, medications, foods, dyes, or preservatives ?Pregnant or trying to get  pregnant ?Breast-feeding ?How should I use this medication? ?Take this medication by mouth. Take it as directed on the prescription label at the same time every day. Do not cut, crush or chew this medication. Swallow the capsules whole. You can take it with or without food. If it upsets your stomach, take it with food. Keep taking it unless your care team tells you to stop. ?Talk to your care team about the use of this medication in children. Special care may be needed. ?Overdosage: If you think you have taken too much of this medicine contact a poison control center or emergency room at once. ?NOTE: This medicine is only for you. Do not share this medicine with others. ?What if I miss a dose? ?If you miss a dose, take it as soon as you can. If it is almost time for your next dose, take only that dose. Do not take double or extra doses. ?What may interact with this medication? ?Do not take this medication with any of the following: ?Feverfew ?Phenothiazines like chlorpromazine, mesoridazine, prochlorperazine, thioridazine ?This medication may also interact with the following: ?Aluminum hydroxide gel ?Antipyrine ?Antiviral medications for HIV or AIDS ?Barbiturates like phenobarbital ?Certain medications for blood pressure, heart disease, irregular heart beat ?Cimetidine ?Ciprofloxacin ?Diazepam ?Fluconazole ?Haloperidol ?Isoniazid ?Medications for cholesterol like cholestyramine or colestipol ?Medications for mental depression ?Medications for migraine headache like almotriptan, eletriptan, frovatriptan, naratriptan, rizatriptan, sumatriptan, zolmitriptan ?NSAIDs, medications for pain and inflammation, like ibuprofen or naproxen ?Phenytoin ?Rifampin ?Teniposide ?Theophylline ?Thyroid medications ?Tolbutamide ?Warfarin ?Zileuton ?This list may not describe all possible interactions. Give your health care provider a list of all the medicines, herbs, non-prescription drugs, or  dietary supplements you use. Also tell them  if you smoke, drink alcohol, or use illegal drugs. Some items may interact with your medicine. ?What should I watch for while using this medication? ?Visit your care team for regular checks on your progress. Check your blood pressure as directed. Ask your care team what your blood pressure should be. Also, find out when you should contact him or her. ?Do not treat yourself for coughs, colds, or pain while you are using this medication without asking your care team for advice. Some medications may increase your blood pressure. ?You may get drowsy or dizzy. Do not drive, use machinery, or do anything that needs mental alertness until you know how this medication affects you. Do not stand up or sit up quickly, especially if you are an older patient. This reduces the risk of dizzy or fainting spells. Alcohol may interfere with the effect of this medication. Avoid alcoholic drinks. ?This medication may increase blood sugar. Ask your care team if changes in diet or medications are needed if you have diabetes. ?Do not suddenly stop taking this medication. You may develop severe heart-related effects. Your care team will tell you how much medication to take. If your care team wants you to stop the medication, the dose may be slowly lowered over time to avoid any side effects. ?What side effects may I notice from receiving this medication? ?Side effects that you should report to your care team as soon as possible: ?Allergic reactions--skin rash, itching, hives, swelling of the face, lips, tongue, or throat ?Heart failure--shortness of breath, swelling of the ankles, feet, or hands, sudden weight gain, unusual weakness or fatigue ?Low blood pressure--dizziness, feeling faint or lightheaded, blurry vision ?Raynaud's--cool, numb, or painful fingers or toes that may change color from pale, to blue, to red ?Redness, blistering, peeling, or loosening of the skin, including inside the mouth ?Slow heartbeat--dizziness, feeling faint  or lightheaded, confusion, trouble breathing, unusual weakness or fatigue ?Worsening mood, feelings of depression ?Side effects that usually do not require medical attention (report to your care team if they continue or are bothersome): ?Change in sex drive or performance ?Diarrhea ?Dizziness ?Fatigue ?Headache ?This list may not describe all possible side effects. Call your doctor for medical advice about side effects. You may report side effects to FDA at 1-800-FDA-1088. ?Where should I keep my medication? ?Keep out of the reach of children and pets. ?Store at room temperature between 15 and 30 degrees C (59 and 86 degrees F). Protect from light and moisture. Keep the container tightly closed. Avoid exposure to extreme heat. Do not freeze. Throw away any unused medication after the expiration date. ?NOTE: This sheet is a summary. It may not cover all possible information. If you have questions about this medicine, talk to your doctor, pharmacist, or health care provider. ?? 2022 Elsevier/Gold Standard (2021-02-12 00:00:00) ? ?

## 2021-08-22 ENCOUNTER — Telehealth: Payer: Self-pay | Admitting: Neurology

## 2021-08-22 LAB — COMPREHENSIVE METABOLIC PANEL
ALT: 31 IU/L (ref 0–44)
AST: 22 IU/L (ref 0–40)
Albumin/Globulin Ratio: 2 (ref 1.2–2.2)
Albumin: 4.7 g/dL (ref 4.1–5.2)
Alkaline Phosphatase: 91 IU/L (ref 44–121)
BUN/Creatinine Ratio: 13 (ref 9–20)
BUN: 13 mg/dL (ref 6–20)
Bilirubin Total: 0.3 mg/dL (ref 0.0–1.2)
CO2: 22 mmol/L (ref 20–29)
Calcium: 9.7 mg/dL (ref 8.7–10.2)
Chloride: 107 mmol/L — ABNORMAL HIGH (ref 96–106)
Creatinine, Ser: 0.98 mg/dL (ref 0.76–1.27)
Globulin, Total: 2.4 g/dL (ref 1.5–4.5)
Glucose: 106 mg/dL — ABNORMAL HIGH (ref 70–99)
Potassium: 4 mmol/L (ref 3.5–5.2)
Sodium: 141 mmol/L (ref 134–144)
Total Protein: 7.1 g/dL (ref 6.0–8.5)
eGFR: 107 mL/min/{1.73_m2} (ref >59)

## 2021-08-22 LAB — CBC WITH DIFFERENTIAL/PLATELET
Basophils Absolute: 0 10*3/uL (ref 0.0–0.2)
Basos: 1 %
EOS (ABSOLUTE): 0.1 10*3/uL (ref 0.0–0.4)
Eos: 1 %
Hematocrit: 48 % (ref 37.5–51.0)
Hemoglobin: 16.1 g/dL (ref 13.0–17.7)
Immature Grans (Abs): 0 10*3/uL (ref 0.0–0.1)
Immature Granulocytes: 0 %
Lymphocytes Absolute: 1.9 10*3/uL (ref 0.7–3.1)
Lymphs: 30 %
MCH: 28.2 pg (ref 26.6–33.0)
MCHC: 33.5 g/dL (ref 31.5–35.7)
MCV: 84 fL (ref 79–97)
Monocytes Absolute: 0.8 10*3/uL (ref 0.1–0.9)
Monocytes: 12 %
Neutrophils Absolute: 3.6 10*3/uL (ref 1.4–7.0)
Neutrophils: 56 %
Platelets: 227 10*3/uL (ref 150–450)
RBC: 5.7 x10E6/uL (ref 4.14–5.80)
RDW: 13.1 % (ref 11.6–15.4)
WBC: 6.5 10*3/uL (ref 3.4–10.8)

## 2021-08-22 LAB — TSH: TSH: 1.73 u[IU]/mL (ref 0.450–4.500)

## 2021-08-22 LAB — LIPID PANEL
Chol/HDL Ratio: 5 ratio (ref 0.0–5.0)
Cholesterol, Total: 170 mg/dL (ref 100–199)
HDL: 34 mg/dL — ABNORMAL LOW (ref >39)
LDL Chol Calc (NIH): 118 mg/dL — ABNORMAL HIGH (ref 0–99)
Triglycerides: 99 mg/dL (ref 0–149)
VLDL Cholesterol Cal: 18 mg/dL (ref 5–40)

## 2021-08-22 LAB — HEMOGLOBIN A1C
Est. average glucose Bld gHb Est-mCnc: 103 mg/dL
Hgb A1c MFr Bld: 5.2 % (ref 4.8–5.6)

## 2021-08-22 LAB — T4, FREE: Free T4: 1.23 ng/dL (ref 0.82–1.77)

## 2021-08-22 NOTE — Telephone Encounter (Signed)
Referral sent to [facility] [phone] 

## 2021-08-27 ENCOUNTER — Encounter (INDEPENDENT_AMBULATORY_CARE_PROVIDER_SITE_OTHER): Payer: Self-pay

## 2021-09-03 ENCOUNTER — Ambulatory Visit (INDEPENDENT_AMBULATORY_CARE_PROVIDER_SITE_OTHER): Payer: 59 | Admitting: Bariatrics

## 2021-09-04 DIAGNOSIS — M79645 Pain in left finger(s): Secondary | ICD-10-CM | POA: Diagnosis not present

## 2021-09-17 ENCOUNTER — Ambulatory Visit (INDEPENDENT_AMBULATORY_CARE_PROVIDER_SITE_OTHER): Payer: 59 | Admitting: Bariatrics

## 2021-09-23 DIAGNOSIS — M79645 Pain in left finger(s): Secondary | ICD-10-CM | POA: Diagnosis not present
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
## 2021-10-10 DIAGNOSIS — M542 Cervicalgia: Secondary | ICD-10-CM | POA: Diagnosis not present

## 2021-11-08 DIAGNOSIS — S60221A Contusion of right hand, initial encounter: Secondary | ICD-10-CM | POA: Diagnosis not present

## 2021-11-08 DIAGNOSIS — M79641 Pain in right hand: Secondary | ICD-10-CM | POA: Diagnosis not present

## 2021-11-26 ENCOUNTER — Telehealth: Payer: 59 | Admitting: Adult Health

## 2021-11-28 ENCOUNTER — Ambulatory Visit: Payer: 59 | Admitting: Adult Health

## 2021-11-28 ENCOUNTER — Encounter: Payer: Self-pay | Admitting: Adult Health

## 2021-11-28 VITALS — BP 137/86 | HR 76 | Ht 71.0 in | Wt 274.0 lb

## 2021-11-28 DIAGNOSIS — R69 Illness, unspecified: Secondary | ICD-10-CM | POA: Diagnosis not present

## 2021-11-28 DIAGNOSIS — R42 Dizziness and giddiness: Secondary | ICD-10-CM | POA: Diagnosis not present

## 2021-11-28 DIAGNOSIS — F0781 Postconcussional syndrome: Secondary | ICD-10-CM | POA: Diagnosis not present

## 2021-11-28 NOTE — Patient Instructions (Signed)
Your Plan: ? ?Continue ? ? ? ? ?Thank you for coming to see us at [facility]. I hope we have been able to provide you high quality care today. ? ?You may receive a patient satisfaction survey over the next few weeks. We would appreciate your feedback and comments so that we may continue to improve ourselves and the health of our patients. ? ?
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
## 2021-12-05 ENCOUNTER — Ambulatory Visit: Payer: 59 | Attending: Adult Health | Admitting: Physical Therapy

## 2021-12-05 DIAGNOSIS — R42 Dizziness and giddiness: Secondary | ICD-10-CM | POA: Diagnosis not present

## 2021-12-05 DIAGNOSIS — M542 Cervicalgia: Secondary | ICD-10-CM | POA: Diagnosis not present
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
## 2021-12-05 NOTE — Patient Instructions (Signed)
  Gaze Stabilization: Tip Card  1.Target must remain in focus, not blurry, and appear stationary while head is in motion. 2.Perform exercises with small head movements (45 to either side of midline). 3.Increase speed of head motion so long as target is in focus. 4.If you wear eyeglasses, be sure you can see target through lens (therapist will give specific instructions for bifocal / progressive lenses). 5.These exercises may provoke dizziness or nausea. Work through these symptoms. If too dizzy, slow head movement slightly. Rest between each exercise. 6.Exercises demand concentration; avoid distractions. 7.For safety, perform standing exercises close to a counter, wall, corner, or next to someone.     Gaze Stabilization: Standing Feet Apart    Feet shoulder width apart, keeping eyes on target on wall ____ feet away, tilt head down 15-30 and move head side to side for ____ seconds. Repeat while moving head up and down for ____ seconds. Do ____ sessions per day. Repeat using target on pattern background.  Copyright  VHI. All rights reserved.   

## 2021-12-11 DIAGNOSIS — M542 Cervicalgia: Secondary | ICD-10-CM | POA: Diagnosis not present

## 2021-12-11 DIAGNOSIS — M5416 Radiculopathy, lumbar region: Secondary | ICD-10-CM | POA: Diagnosis not present

## 2021-12-25 DIAGNOSIS — M9902 Segmental and somatic dysfunction of thoracic region: Secondary | ICD-10-CM | POA: Diagnosis not present

## 2021-12-25 DIAGNOSIS — M9901 Segmental and somatic dysfunction of cervical region: Secondary | ICD-10-CM | POA: Diagnosis not present

## 2021-12-25 DIAGNOSIS — M9903 Segmental and somatic dysfunction of lumbar region: Secondary | ICD-10-CM | POA: Diagnosis not present

## 2021-12-25 DIAGNOSIS — M5386 Other specified dorsopathies, lumbar region: Secondary | ICD-10-CM | POA: Diagnosis not present

## 2021-12-25 DIAGNOSIS — R519 Headache, unspecified: Secondary | ICD-10-CM | POA: Diagnosis not present

## 2021-12-30 DIAGNOSIS — R519 Headache, unspecified: Secondary | ICD-10-CM | POA: Diagnosis not present

## 2021-12-30 DIAGNOSIS — M5386 Other specified dorsopathies, lumbar region: Secondary | ICD-10-CM | POA: Diagnosis not present

## 2021-12-30 DIAGNOSIS — M9903 Segmental and somatic dysfunction of lumbar region: Secondary | ICD-10-CM | POA: Diagnosis not present

## 2021-12-30 DIAGNOSIS — M9901 Segmental and somatic dysfunction of cervical region: Secondary | ICD-10-CM | POA: Diagnosis not present

## 2021-12-30 DIAGNOSIS — M9902 Segmental and somatic dysfunction of thoracic region: Secondary | ICD-10-CM | POA: Diagnosis not present

## 2022-01-01 DIAGNOSIS — M9902 Segmental and somatic dysfunction of thoracic region: Secondary | ICD-10-CM | POA: Diagnosis not present

## 2022-01-01 DIAGNOSIS — M9901 Segmental and somatic dysfunction of cervical region: Secondary | ICD-10-CM | POA: Diagnosis not present

## 2022-01-01 DIAGNOSIS — M5386 Other specified dorsopathies, lumbar region: Secondary | ICD-10-CM | POA: Diagnosis not present

## 2022-01-01 DIAGNOSIS — M9903 Segmental and somatic dysfunction of lumbar region: Secondary | ICD-10-CM | POA: Diagnosis not present

## 2022-01-01 DIAGNOSIS — R519 Headache, unspecified: Secondary | ICD-10-CM | POA: Diagnosis not present

## 2022-01-06 DIAGNOSIS — M9903 Segmental and somatic dysfunction of lumbar region: Secondary | ICD-10-CM | POA: Diagnosis not present

## 2022-01-06 DIAGNOSIS — M9902 Segmental and somatic dysfunction of thoracic region: Secondary | ICD-10-CM | POA: Diagnosis not present

## 2022-01-06 DIAGNOSIS — M5386 Other specified dorsopathies, lumbar region: Secondary | ICD-10-CM | POA: Diagnosis not present

## 2022-01-06 DIAGNOSIS — R519 Headache, unspecified: Secondary | ICD-10-CM | POA: Diagnosis not present

## 2022-01-06 DIAGNOSIS — M9901 Segmental and somatic dysfunction of cervical region: Secondary | ICD-10-CM | POA: Diagnosis not present

## 2022-01-07 DIAGNOSIS — R69 Illness, unspecified: Secondary | ICD-10-CM | POA: Diagnosis not present

## 2022-01-07 DIAGNOSIS — F909 Attention-deficit hyperactivity disorder, unspecified type: Secondary | ICD-10-CM | POA: Diagnosis not present

## 2022-01-08 DIAGNOSIS — M5386 Other specified dorsopathies, lumbar region: Secondary | ICD-10-CM | POA: Diagnosis not present

## 2022-01-08 DIAGNOSIS — M9901 Segmental and somatic dysfunction of cervical region: Secondary | ICD-10-CM | POA: Diagnosis not present

## 2022-01-08 DIAGNOSIS — R519 Headache, unspecified: Secondary | ICD-10-CM | POA: Diagnosis not present

## 2022-01-08 DIAGNOSIS — M9903 Segmental and somatic dysfunction of lumbar region: Secondary | ICD-10-CM | POA: Diagnosis not present

## 2022-01-08 DIAGNOSIS — M9902 Segmental and somatic dysfunction of thoracic region: Secondary | ICD-10-CM | POA: Diagnosis not present

## 2022-01-13 DIAGNOSIS — M5386 Other specified dorsopathies, lumbar region: Secondary | ICD-10-CM | POA: Diagnosis not present

## 2022-01-13 DIAGNOSIS — M9902 Segmental and somatic dysfunction of thoracic region: Secondary | ICD-10-CM | POA: Diagnosis not present

## 2022-01-13 DIAGNOSIS — M9903 Segmental and somatic dysfunction of lumbar region: Secondary | ICD-10-CM | POA: Diagnosis not present

## 2022-01-13 DIAGNOSIS — R519 Headache, unspecified: Secondary | ICD-10-CM | POA: Diagnosis not present

## 2022-01-13 DIAGNOSIS — M9901 Segmental and somatic dysfunction of cervical region: Secondary | ICD-10-CM | POA: Diagnosis not present

## 2022-01-15 ENCOUNTER — Encounter (INDEPENDENT_AMBULATORY_CARE_PROVIDER_SITE_OTHER): Payer: Self-pay

## 2022-01-22 DIAGNOSIS — M542 Cervicalgia: Secondary | ICD-10-CM | POA: Diagnosis not present

## 2022-01-22 DIAGNOSIS — Z5189 Encounter for other specified aftercare: Secondary | ICD-10-CM | POA: Diagnosis not present

## 2022-01-28 DIAGNOSIS — R69 Illness, unspecified: Secondary | ICD-10-CM | POA: Diagnosis not present

## 2022-02-22 DIAGNOSIS — M5412 Radiculopathy, cervical region: Secondary | ICD-10-CM | POA: Diagnosis not present

## 2022-02-27 DIAGNOSIS — R42 Dizziness and giddiness: Secondary | ICD-10-CM | POA: Diagnosis not present

## 2022-02-27 DIAGNOSIS — M5481 Occipital neuralgia: Secondary | ICD-10-CM | POA: Diagnosis not present

## 2022-05-31 DIAGNOSIS — R509 Fever, unspecified: Secondary | ICD-10-CM | POA: Diagnosis not present

## 2022-05-31 DIAGNOSIS — R0981 Nasal congestion: Secondary | ICD-10-CM | POA: Diagnosis not present

## 2022-05-31 DIAGNOSIS — J069 Acute upper respiratory infection, unspecified: Secondary | ICD-10-CM | POA: Diagnosis not present

## 2022-05-31 DIAGNOSIS — R519 Headache, unspecified: Secondary | ICD-10-CM | POA: Diagnosis not present

## 2022-05-31 DIAGNOSIS — J111 Influenza due to unidentified influenza virus with other respiratory manifestations: Secondary | ICD-10-CM | POA: Diagnosis not present

## 2022-05-31 DIAGNOSIS — R051 Acute cough: Secondary | ICD-10-CM | POA: Diagnosis not present

## 2022-06-25 ENCOUNTER — Ambulatory Visit: Payer: 59 | Admitting: Adult Health

## 2022-06-25 ENCOUNTER — Encounter: Payer: Self-pay | Admitting: Adult Health

## 2022-11-19 ENCOUNTER — Encounter: Payer: Self-pay | Admitting: Gastroenterology

## 2022-11-27 ENCOUNTER — Encounter: Payer: Self-pay | Admitting: Gastroenterology

## 2022-11-27 ENCOUNTER — Other Ambulatory Visit (INDEPENDENT_AMBULATORY_CARE_PROVIDER_SITE_OTHER): Payer: 59

## 2022-11-27 ENCOUNTER — Ambulatory Visit: Payer: 59 | Admitting: Gastroenterology

## 2022-11-27 VITALS — BP 126/80 | HR 88 | Ht 71.0 in | Wt 270.0 lb

## 2022-11-27 DIAGNOSIS — R1084 Generalized abdominal pain: Secondary | ICD-10-CM

## 2022-11-27 DIAGNOSIS — R110 Nausea: Secondary | ICD-10-CM

## 2022-11-27 DIAGNOSIS — R197 Diarrhea, unspecified: Secondary | ICD-10-CM

## 2022-11-27 LAB — CBC WITH DIFFERENTIAL/PLATELET
Basophils Absolute: 0 10*3/uL (ref 0.0–0.1)
Basophils Relative: 0.6 % (ref 0.0–3.0)
Eosinophils Absolute: 0 10*3/uL (ref 0.0–0.7)
Eosinophils Relative: 0.5 % (ref 0.0–5.0)
HCT: 48.8 % (ref 39.0–52.0)
Hemoglobin: 16.4 g/dL (ref 13.0–17.0)
Lymphocytes Relative: 23.3 % (ref 12.0–46.0)
Lymphs Abs: 1.7 10*3/uL (ref 0.7–4.0)
MCHC: 33.6 g/dL (ref 30.0–36.0)
MCV: 85.8 fl (ref 78.0–100.0)
Monocytes Absolute: 0.6 10*3/uL (ref 0.1–1.0)
Monocytes Relative: 8.7 % (ref 3.0–12.0)
Neutro Abs: 4.8 10*3/uL (ref 1.4–7.7)
Neutrophils Relative %: 66.9 % (ref 43.0–77.0)
Platelets: 220 10*3/uL (ref 150.0–400.0)
RBC: 5.68 Mil/uL (ref 4.22–5.81)
RDW: 13.3 % (ref 11.5–15.5)
WBC: 7.1 10*3/uL (ref 4.0–10.5)

## 2022-11-27 LAB — COMPREHENSIVE METABOLIC PANEL
ALT: 32 U/L (ref 0–53)
AST: 23 U/L (ref 0–37)
Albumin: 4.7 g/dL (ref 3.5–5.2)
Alkaline Phosphatase: 68 U/L (ref 39–117)
BUN: 12 mg/dL (ref 6–23)
CO2: 27 mEq/L (ref 19–32)
Calcium: 9.6 mg/dL (ref 8.4–10.5)
Chloride: 103 mEq/L (ref 96–112)
Creatinine, Ser: 0.95 mg/dL (ref 0.40–1.50)
GFR: 107.11 mL/min (ref >60.00)
Glucose, Bld: 91 mg/dL (ref 70–99)
Potassium: 3.9 mEq/L (ref 3.5–5.1)
Sodium: 139 mEq/L (ref 135–145)
Total Bilirubin: 0.5 mg/dL (ref 0.2–1.2)
Total Protein: 7.7 g/dL (ref 6.0–8.3)

## 2022-11-27 LAB — TSH: TSH: 0.77 u[IU]/mL (ref 0.35–5.50)

## 2022-11-27 LAB — C-REACTIVE PROTEIN: CRP: <1 mg/dL (ref 0.5–20.0)

## 2022-11-27 LAB — SEDIMENTATION RATE: Sed Rate: 6 mm/hr (ref 0–15)

## 2022-11-27 MED ORDER — HYOSCYAMINE SULFATE 0.125 MG SL SUBL: 0.1250 mg | SUBLINGUAL_TABLET | Freq: Four times a day (QID) | SUBLINGUAL | 2 refills | Status: DC | PRN

## 2022-11-27 NOTE — Progress Notes (Signed)
11/27/2022 Derek Hoffman [id number] 12-02-1990   HISTORY OF PRESENT ILLNESS: This is a 32 year old male who is known to our office.  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  He is here today with complaints of abdominal pain, diarrhea, nausea.  Tells me that chronically he has always had issues with having to have a bowel movement after eating.  Sometimes the stool is normal, sometimes is diarrhea.  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  Says though about a week or 2 ago he developed mid abdominal pain with nausea and severe diarrhea.  He denies any vomiting although he felt like he needed to at times.  He denies any blood in his stool.  At this point he is feeling little bit better, but does still have pain (now more dull pain but was sharp previously) and obviously still has the baseline diarrhea (some normal stools though).  Symptoms worse after eating.  No heartburn or reflux.  Did not seek any evaluation for this elsewhere.   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   Depression    History of MRSA infection 2015   left arm   Lateral meniscus tear 12/2014   left   Past Surgical History:  Procedure Laterality Date   CHONDROPLASTY Left 01/11/2015   Procedure: CHONDROPLASTY;  Surgeon: [name], MD;  Location: [facility];  Service: Orthopedics;  Laterality: Left;   FEMUR HARDWARE REMOVAL Left 10/28/2007   KNEE ARTHROSCOPY Left 10/28/2007   with manipulation   KNEE ARTHROSCOPY WITH EXCISION PLICA Left 01/11/2015   Procedure: KNEE ARTHROSCOPY WITH EXCISION PLICA;  Surgeon: [name], MD;  Location: [facility];  Service: Orthopedics;  Laterality: Left;   KNEE ARTHROSCOPY WITH LATERAL MENISECTOMY Left 01/11/2015   Procedure: LEFT KNEE ARTHROSCOPY  WITH LATERAL MENISCECTOMY, EXCISION OF MEDIAL PLICA, CHONDROPLASTY;  Surgeon: [name], MD;  Location: [facility];   Service: Orthopedics;  Laterality: Left;   NASAL SEPTOPLASTY W/ TURBINOPLASTY Bilateral 10/08/2020   Procedure: REVISION NASAL SEPTOPLASTY WITH TURBINATE REDUCTION AND SEPTUM REVISION;  Surgeon: Newman Pies, MD;  Location: [facility];  Service: ENT;  Laterality: Bilateral;   ORIF FEMUR FRACTURE Left    SEPTOPLASTY     SHOULDER ARTHROSCOPY WITH ROTATOR CUFF REPAIR Left 06/27/2021   Procedure: Left Shoulder arthroscopy, debridement, subacromial decompression, distal clavicle resection,;  Surgeon: Francena Hanly, MD;  Location: [facility];  Service: Orthopedics;  Laterality: Left;    TONSILLECTOMY AND ADENOIDECTOMY     WISDOM TOOTH EXTRACTION      reports that he quit smoking about 12 years ago. His smoking use included cigarettes. He has a 0.25 pack-year smoking history. He quit smokeless tobacco use about 3 years ago.  His smokeless tobacco use included snuff. He reports that he does not currently use alcohol. He reports that he does not use drugs. family history includes Hypertension in his father. No Known Allergies    Outpatient Encounter Medications as of 11/27/2022  Medication Sig   citalopram (CELEXA) 20 MG tablet take 1 tablet by mouth once daily   cyclobenzaprine (FLEXERIL) 10 MG tablet Take 1 tablet (10 mg total) by mouth 3 (three) times daily as needed for muscle spasms. (Patient not taking: Reported on 11/27/2022)   dextromethorphan-guaiFENesin (MUCINEX DM) 30-600 MG 12hr tablet Take 1 tablet by mouth as needed for cough. (Patient not taking: Reported on  11/27/2022)   naproxen (NAPROSYN) 500 MG tablet Take 1 tablet (500 mg total) by mouth 2 (two) times daily with a meal. (Patient not taking: Reported on 11/27/2022)   ondansetron (ZOFRAN) 4 MG tablet Take 1 tablet (4 mg total) by mouth every 8 (eight) hours as needed for nausea or vomiting. (Patient not taking: Reported on 11/27/2022)   No facility-administered encounter medications on file as of 11/27/2022.    REVIEW OF  SYSTEMS  : All other systems reviewed and negative except where noted in the History of Present Illness.   PHYSICAL EXAM: BP 126/80   Pulse 88   Ht 5\' 11"  (1.803 m)   Wt 270 lb (122.5 kg)   BMI 37.66 kg/m  General: Well developed white male in no acute distress Head: Normocephalic and atraumatic Eyes:  Sclerae anicteric, conjunctiva pink. Ears: Normal auditory acuity Lungs: Clear throughout to auscultation; no W/R/R. Heart: Regular rate and rhythm; no M/R/G. Abdomen: Soft, non-distended.  BS present and somewhat hyperactive.  Mild diffuse TTP. Musculoskeletal: Symmetrical with no gross deformities  Skin: No lesions on visible extremities Extremities: No edema  Neurological: Alert oriented x 4, grossly non-focal Psychological:  Alert and cooperative. Normal mood and affect  ASSESSMENT AND PLAN: *32 year old male with complaints of chronic intermittent diarrhea, but more acutely a week or 2 ago had more sudden onset severe diarrhea with mid abdominal pain and nausea.  Symptoms definitely improved, but still having pain and still with the intermittent diarrhea.  Will plan for labs today including CBC, CMP, sed rate, CRP, celiac labs, TSH.  Will plan for CT scan of the abdomen and pelvis with contrast.  He is not interested in colonoscopy at this point so we will see what happens pending the results of the above.  Will try Levsin 0.1 2 5  milligrams sublingual as needed.  Prescription sent to pharmacy.  Could be component of IBS.   CC:  No ref. provider found

## 2022-11-27 NOTE — Patient Instructions (Addendum)
Your provider has requested that you go to the basement level for lab work before leaving today. Press "B" on the elevator. The lab is located at the first door on the left as you exit the elevator.  We have sent the following medications to your pharmacy for you to pick up at your convenience: Levsin SL 0.125 mg every 6-8 hours as needed for abdominal pain.    You have been scheduled for a CT scan of the abdomen and pelvis at [facility]. You are scheduled on Saturday 12/06/22 at 1 pm. Please arrive at 11 am to drink oral contrast.   Please follow the written instructions below on the day of your exam:   1) Do not eat anything after 9 am (4 hours prior to your test)   You may take any medications as prescribed with a small amount of water, if necessary. If you take any of the following medications: METFORMIN, GLUCOPHAGE, GLUCOVANCE, AVANDAMET, RIOMET, FORTAMET, ACTOPLUS MET, JANUMET, GLUMETZA or METAGLIP, you MAY be asked to HOLD this medication 48 hours AFTER the exam.   The purpose of you drinking the oral contrast is to aid in the visualization of your intestinal tract. The contrast solution may cause some diarrhea. Depending on your individual set of symptoms, you may also receive an intravenous injection of x-ray contrast/dye. Plan on being at [facility] for 45 minutes or longer, depending on the type of exam you are having performed.   If you have any questions regarding your exam or if you need to reschedule, you may call [facility] at [phone] between the hours of 8:00 am and 5:00 pm, Monday-Friday.

## 2022-11-28 LAB — TISSUE TRANSGLUTAMINASE ABS,IGG,IGA
(tTG) Ab, IgA: <1 U/mL
(tTG) Ab, IgG: <1 U/mL

## 2022-11-28 LAB — IGA: Immunoglobulin A: 170 mg/dL (ref 47–310)

## 2022-12-03 NOTE — Progress Notes (Signed)
Agree with assessment/plan. Pt wants to hold off on colon  Edman Circle, MD [facility] [phone]

## 2022-12-06 ENCOUNTER — Ambulatory Visit (HOSPITAL_BASED_OUTPATIENT_CLINIC_OR_DEPARTMENT_OTHER): Payer: 59

## 2023-01-01 DIAGNOSIS — M47812 Spondylosis without myelopathy or radiculopathy, cervical region: Secondary | ICD-10-CM | POA: Diagnosis not present

## 2023-01-15 DIAGNOSIS — M47812 Spondylosis without myelopathy or radiculopathy, cervical region: Secondary | ICD-10-CM | POA: Diagnosis not present

## 2023-02-24 DIAGNOSIS — R051 Acute cough: Secondary | ICD-10-CM | POA: Diagnosis not present

## 2023-02-24 DIAGNOSIS — R519 Headache, unspecified: Secondary | ICD-10-CM | POA: Diagnosis not present

## 2023-02-24 DIAGNOSIS — R0981 Nasal congestion: Secondary | ICD-10-CM | POA: Diagnosis not present

## 2023-02-24 DIAGNOSIS — J019 Acute sinusitis, unspecified: Secondary | ICD-10-CM | POA: Diagnosis not present

## 2023-02-24 DIAGNOSIS — R509 Fever, unspecified: Secondary | ICD-10-CM | POA: Diagnosis not present

## 2023-03-03 DIAGNOSIS — L918 Other hypertrophic disorders of the skin: Secondary | ICD-10-CM | POA: Diagnosis not present

## 2023-03-03 DIAGNOSIS — D225 Melanocytic nevi of trunk: Secondary | ICD-10-CM | POA: Diagnosis not present

## 2023-03-03 DIAGNOSIS — L821 Other seborrheic keratosis: Secondary | ICD-10-CM | POA: Diagnosis not present

## 2023-03-07 DIAGNOSIS — M47812 Spondylosis without myelopathy or radiculopathy, cervical region: Secondary | ICD-10-CM | POA: Diagnosis not present

## 2023-05-10 HISTORY — PX: RADIOFREQUENCY ABLATION: SHX2290

## 2023-05-15 DIAGNOSIS — M47812 Spondylosis without myelopathy or radiculopathy, cervical region: Secondary | ICD-10-CM | POA: Diagnosis not present

## 2023-05-25 DIAGNOSIS — F419 Anxiety disorder, unspecified: Secondary | ICD-10-CM | POA: Diagnosis not present

## 2023-05-25 DIAGNOSIS — F909 Attention-deficit hyperactivity disorder, unspecified type: Secondary | ICD-10-CM | POA: Diagnosis not present

## 2023-05-25 DIAGNOSIS — E7800 Pure hypercholesterolemia, unspecified: Secondary | ICD-10-CM | POA: Diagnosis not present

## 2023-06-13 DIAGNOSIS — M5412 Radiculopathy, cervical region: Secondary | ICD-10-CM | POA: Diagnosis not present

## 2023-06-14 ENCOUNTER — Ambulatory Visit (HOSPITAL_COMMUNITY)
Admission: EM | Admit: 2023-06-14 | Discharge: 2023-06-14 | Disposition: A | Discharge status: Home / Self Care | Payer: 59

## 2023-06-14 DIAGNOSIS — F4323 Adjustment disorder with mixed anxiety and depressed mood: Secondary | ICD-10-CM

## 2023-06-14 NOTE — Discharge Instructions (Addendum)
 It is imperative that you follow through with treatment recommendations within 5-7 days from the day of discharge to mitigate further risk to your safety and overall mental well-being.  A list of outpatient therapy and psychiatric providers for medication management has been provided below to get you started in finding the right provider for you.            [facility] [address] [place], [place], [address] [phone] phone (Medicare, Private insurance except Tricare, [place] Adwolf, and [facility])  [facility] [address] [place], [place], [address] [address] phone ([address], AmeriHealth [facility] [place], [address], [place], [place], Friday Health Plans, [address], BCBS Healthy [place], [place], [address], [place], [place], [place], [place], Tricare, [facility], Safeco Corporation, Eli Lilly and Company)  [employer] [phone] [address], Suite [place], [place], [address] [phone] phone [phone] phone [phone] fax  [facility] [address] [place], [place], [address] [phone] phone (Call to confirm insurance coverage) Consultation & Support Services     o Drop-In Hours: 1:00 PM to 5:00 PM     o Days: Monday - Thursday  Crisis Services (24/7)   [facility] [address] [place], [place], [address] [phone] phone ([address] [place], [place], [place] Medicaid, [place] and [place], [facility])      [facility] [address] [place], [place], [address] [phone] phone [email or web address]  (to complete the intake form and upload ID and insurance cards)  [facility] [address] [place], [place], [address] [phone] phone ([address], [address], [place], [address] [employer], [place], [place], [place], [facility], [place], and certain Medicaid plans)  [facility] [phone] [address] [place], [place], [address] [phone] phone [phone] fax (Medicaid, Medicare, Self-pay, call about other insurance coverage)  [facility] (age 85+) [address] [place], [place], [address] [phone] hone [phone] fax ([place], [address], [place], [place], [place], [address], [place], [place], [place], [place], certain [employer], [facility], UMR)  [employer] [address] [place], [place], [address] [phone] phone (Medicare, Medicaid, Artemio Aly, call about other insurance coverage)  [facility] [address] [place], [place], [address] [phone] phone [phone] fax (Call [phone] to see what insurance is accepted) Archer Asa, MD specializes in geropsych)  [facility], [facility]  (medication management only) [address] [place], [place], [address] [phone] phone [phone] fax ([address], Medicaid, [place], [place], [place], [place], [place], [place], [place])  [facility] (medication management only) [address] [place], [place], [address] [phone] phone [phone] fax ([address] Medicare, [place], [place], Tricare [place])  [facility] [address] [place], [place], [address] [phone] phone [phone] fax ([address], Tea Collums, [place], [place], [place], [facility], [facility] Medicaid/[place] Health Choice)  [facility] [place], [employer]. [address] [place], [place], [address] [phone] phone [phone] fax (Medicare, Medicaid, [facility])  [facility] [address] [place], [place] [address] [phone] phone ([address], [place], [place], [place], [place], Medicare, Ages, [facility]) Does genetic testing for medications; does transcranial magnetic stimulation along with basic services)  [facility] [address] [place], [place],  [address] [phone] phone (Call about insurance coverage)  [facility] [address] [place], [place], [address] [phone] phone [phone] fax (Call about insurance coverage)  [facility] [address] [place], [place], [address] [phone] phone [phone]  fax (Call about insurance coverage)  [employer] [phone] [address], [place], [address] [phone] phone (Medicaid, Tricare, [place], [place], [place])  [facility] [address] [place], [place], [address] [phone] phone (Medicaid, Medicare, call about other insurance coverage)  [facility] [address] [place], [place], [address] [phone] phone [phone] fax (Medicaid, Medicare, Tricare, call about other insurance coverage)  [facility] [address] [place], [address] [phone] phone ([address], [place], [place], [place], [place] types - Alliance, Secretary/administrator, Partners, [place], [place] Health Choice, Healthy [place], [place], [place], and Complete)  [facility] [address] [place], [place], [address] [phone] phone Completely online treatment platform Contact: Personal assistant - [employer] Specialist [phone] phone [phone] fax ([address], [place], [place], Friday Health Plan, [place], [place], [place], [place], [place], [place])
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
## 2023-06-14 NOTE — ED Provider Notes (Signed)
 Behavioral Health Urgent Care Medical Screening Exam  Patient Name: Derek Hoffman MRN: [id number] Date of Evaluation: 06/14/23 Chief Complaint:   Diagnosis:  Final diagnoses:  None    History of Present illness: Derek Hoffman is a 33 y.o. male who presents voluntarily accompanied by his father to [facility] for complaint of depression and anxiety in the context of work stress and separating from his girlfriend of 3 years.  Patient is seen and examined face-to-face in the assessment room sitting up in a chair. On evaluation, the patient is alert and oriented x 4, calm, cooperative, and mood-congruent with affect.  Case discussed with attending psychiatrist, Dr. Larina.  Speech clear and coherent with normal volume and pattern.  Able to maintain good eye contact with this provider.  Thought content and thought process coherent with relevant. The patient does not appear to be responding to internal or external stimuli. Neither is the patient presenting with any delusional thinking. The patient denies SI, HI, AVH or self-harm ideations. The patient is not presenting with any psychotic or paranoid behaviors. During an encounter with the patient, he could answer questions appropriately.  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  He reportedly broke up with his girlfriend of 3 years and this has been very stressful for him.  Reports not being able to sleep effectively as a result with some weight loss.  However patient is obese and recommend more weight loss at this time.  He reports symptoms of anxiety to include increased heart rate, increased blood pressure, sweaty palms and body aches.  Reports symptoms of depression to include anhedonia, sad mood, psychomotor agitation, hopelessness, decreased appetite, and increased sleep or decreased sleep at times.  Denies symptoms of PTSD, mania, or psychosis.  Report he has an  appointment with a therapy group in 10 days.  He denies alcohol  use, illegal drug use, smoking tobacco, however endorses smoking of marijuana 1-2 blunts daily.  Education provided on the effects of marijuana on the system and neurotransmitters.  Patient currently on Celexa , gabapentin, alprazolam  for depression and anxiety.  Denies access to firearms.  Patient encouraged to attend his scheduled appointment with a therapist as indicated.  Further community resources provided to patient, please see discharge AVS.    Flowsheet Row ED from 06/14/2023 in [facility] Pre-Admission Testing 60 from 06/21/2021 in [facility] Admission (Discharged) from 10/08/2020 in [facility]  C-SSRS RISK CATEGORY No Risk Error: Question 6 not populated No Risk      Psychiatric Specialty Exam  Presentation  General Appearance:Casual; Appropriate for Environment; Fairly Groomed  Eye Contact:Fair  Speech:Clear and Coherent  Speech Volume:Normal  Handedness:Right  Mood and Affect  Mood: Anxious; Depressed  Affect: Congruent  Thought Process  Thought Processes: Coherent; Goal Directed  Descriptions of Associations:Intact  Orientation:Full (Time, Place and Person)  Thought Content:Logical    Hallucinations:Auditory (Hearing a woman's voice outside his father's room only 1 time.  Voice not commanding)  Ideas of Reference:None  Suicidal Thoughts:No  Homicidal Thoughts:No  Sensorium  Memory: Immediate Good; Recent Good  Judgment: Good  Insight: Good  Executive Functions  Concentration: Good  Attention Span: Good  Recall: Good  Fund of Knowledge: Good  Language: Good  Psychomotor Activity  Psychomotor Activity: Normal  Assets  Assets: Communication Skills; Desire for Improvement; Housing; Intimacy; Leisure Time; Physical Health; Resilience; Social Support; Vocational/Educational  Sleep   Sleep: Good  Number of hours:  8  Physical Exam: Physical Exam Vitals and nursing note reviewed.  HENT:     Head: Normocephalic.     Nose: Nose normal.     Mouth/Throat:     Mouth: Mucous membranes are moist.  Cardiovascular:     Rate and Rhythm: Tachycardia present.     Comments: Blood pressure 136/101, pulse 103, possibly due to patient anxiety.  However patient remains asymptomatic. Pulmonary:     Effort: Pulmonary effort is normal.  Abdominal:     Comments: Deferred  Genitourinary:    Comments: Deferred Musculoskeletal:        General: Normal range of motion.     Cervical back: Normal range of motion.  Skin:    General: Skin is warm.  Neurological:     General: No focal deficit present.     Mental Status: He is alert and oriented to person, place, and time.  Psychiatric:        Mood and Affect: Mood normal.        Behavior: Behavior normal.        Thought Content: Thought content normal.   Review of Systems  Constitutional:  Negative for chills and fever.  HENT:  Negative for sore throat.   Eyes:  Negative for blurred vision.  Respiratory:  Negative for cough, sputum production and shortness of breath.   Cardiovascular:  Negative for chest pain and palpitations.  Gastrointestinal:  Negative for abdominal pain, heartburn, nausea and vomiting.  Genitourinary:  Negative for dysuria, frequency and urgency.  Musculoskeletal: Negative.   Skin:  Negative for itching and rash.  Neurological:  Negative for dizziness, tingling and headaches.  Endo/Heme/Allergies:        See allergy listing  Psychiatric/Behavioral:  Positive for depression and substance abuse. Negative for hallucinations and suicidal ideas. The patient is nervous/anxious. The patient does not have insomnia.    Blood pressure (!) 136/101, pulse (!) 103, temperature 97.9 F (36.6 C), temperature source Oral, resp. rate 19, SpO2 99%. There is no height or weight on file to calculate  BMI.  Musculoskeletal: Strength & Muscle Tone: within normal limits Gait & Station: normal Patient leans: N/A   [facility] MSE Discharge Disposition for Follow up and Recommendations: Based on my evaluation the patient does not appear to have an emergency medical condition and can be discharged with resources and follow up care in outpatient services for Medication Management, Individual Therapy, and Group Therapy   Monish Haliburton C Caniyah Murley, FNP 06/14/2023, 3:01 PM

## 2023-06-15 ENCOUNTER — Emergency Department (HOSPITAL_BASED_OUTPATIENT_CLINIC_OR_DEPARTMENT_OTHER): Payer: 59 | Admitting: Radiology

## 2023-06-15 ENCOUNTER — Emergency Department (HOSPITAL_BASED_OUTPATIENT_CLINIC_OR_DEPARTMENT_OTHER)
Admission: EM | Admit: 2023-06-15 | Discharge: 2023-06-15 | Disposition: A | Discharge status: Home / Self Care | Payer: 59 | Attending: Emergency Medicine | Admitting: Emergency Medicine

## 2023-06-15 DIAGNOSIS — R079 Chest pain, unspecified: Secondary | ICD-10-CM | POA: Diagnosis not present

## 2023-06-15 DIAGNOSIS — R0789 Other chest pain: Secondary | ICD-10-CM | POA: Insufficient documentation

## 2023-06-15 LAB — COMPREHENSIVE METABOLIC PANEL
ALT: 28 U/L (ref 0–44)
AST: 15 U/L (ref 15–41)
Albumin: 4.9 g/dL (ref 3.5–5.0)
Alkaline Phosphatase: 55 U/L (ref 38–126)
Anion gap: 7 (ref 5–15)
BUN: 10 mg/dL (ref 6–20)
CO2: 28 mmol/L (ref 22–32)
Calcium: 9.6 mg/dL (ref 8.9–10.3)
Chloride: 101 mmol/L (ref 98–111)
Creatinine, Ser: 0.99 mg/dL (ref 0.61–1.24)
GFR, Estimated: >60 mL/min (ref >60)
Glucose, Bld: 105 mg/dL — ABNORMAL HIGH (ref 70–99)
Potassium: 4.1 mmol/L (ref 3.5–5.1)
Sodium: 136 mmol/L (ref 135–145)
Total Bilirubin: 0.9 mg/dL (ref 0.0–1.2)
Total Protein: 7.8 g/dL (ref 6.5–8.1)

## 2023-06-15 LAB — CBC WITH DIFFERENTIAL/PLATELET
Abs Immature Granulocytes: 0.01 10*3/uL (ref 0.00–0.07)
Basophils Absolute: 0 10*3/uL (ref 0.0–0.1)
Basophils Relative: 0 %
Eosinophils Absolute: 0.1 10*3/uL (ref 0.0–0.5)
Eosinophils Relative: 2 %
HCT: 50 % (ref 39.0–52.0)
Hemoglobin: 17.4 g/dL — ABNORMAL HIGH (ref 13.0–17.0)
Immature Granulocytes: 0 %
Lymphocytes Relative: 19 %
Lymphs Abs: 1.2 10*3/uL (ref 0.7–4.0)
MCH: 29 pg (ref 26.0–34.0)
MCHC: 34.8 g/dL (ref 30.0–36.0)
MCV: 83.5 fL (ref 80.0–100.0)
Monocytes Absolute: 0.6 10*3/uL (ref 0.1–1.0)
Monocytes Relative: 9 %
Neutro Abs: 4.5 10*3/uL (ref 1.7–7.7)
Neutrophils Relative %: 70 %
Platelets: 208 10*3/uL (ref 150–400)
RBC: 5.99 MIL/uL — ABNORMAL HIGH (ref 4.22–5.81)
RDW: 12.7 % (ref 11.5–15.5)
WBC: 6.4 10*3/uL (ref 4.0–10.5)
nRBC: 0 % (ref 0.0–0.2)

## 2023-06-15 LAB — TSH: TSH: 0.802 u[IU]/mL (ref 0.350–4.500)

## 2023-06-15 LAB — TROPONIN I (HIGH SENSITIVITY): Troponin I (High Sensitivity): 2 ng/L (ref <18)

## 2023-06-15 NOTE — ED Triage Notes (Signed)
 Pt c/o CP and left arm pain x2-3 wks reporting that pain has been intermittent and is worse at night. Pt reports recent changes in meds d/t increased anxiety.

## 2023-06-15 NOTE — Discharge Instructions (Addendum)
 Your workup today is reassuring. It is very unlikely that your pain is due to an issue in your heart.  Your cardiac enzyme (troponin) was normal today. Your EKG which is a measure of the heart's rhythm and electrical activity is normal today. These would both show abnormalities if you were having a heart attack.  Your chest x-ray is normal today.  Your lab work including your blood counts, electrolytes, and thyroid  function is normal.  Please follow-up with your PCP within the next week to discuss further management of your anxiety and for monitoring of your symptoms.  Return to the ER if you have any shortness of breath, difficulty breathing, worsening chest pain, dizziness, jaw pain, left arm or shoulder pain, abdominal pain, fever.

## 2023-06-15 NOTE — ED Provider Notes (Signed)
 [place] EMERGENCY DEPARTMENT AT [facility] Provider Note   CSN: [id number] Arrival date & time: 06/15/23  9052     History  Chief Complaint  Patient presents with   Chest Pain    Derek Hoffman is a 33 y.o. male with history of anxiety, depression, presents with concern for intermittent chest pain left arm pain for the past 2 to 3 weeks.  States it usually comes on with his anxiety attacks.  Pain improves when he takes his Xanax .  Denies any shortness of breath.  Denies any leg pain or swelling, denies any recent periods of immobilization, denies any history of blood clots.  He recently had a change in his gabapentin medication.   Chest Pain      Home Medications Prior to Admission medications   Medication Sig Start Date End Date Taking? Authorizing Provider  citalopram  (CELEXA ) 20 MG tablet take 1 tablet by mouth once daily 06/12/12   Paz, Jose E, MD  hyoscyamine  (LEVSIN  SL) 0.125 MG SL tablet Place 1 tablet (0.125 mg total) under the tongue every 6 (six) hours as needed. 11/27/22   Zehr, Jessica D, PA-C      Allergies    Patient has no known allergies.    Review of Systems   Review of Systems  Cardiovascular:  Positive for chest pain.    Physical Exam Updated Vital Signs BP 115/75   Pulse (!) 58   Temp 98.5 F (36.9 C)   Resp 18   SpO2 97%  Physical Exam Vitals and nursing note reviewed.  Constitutional:      General: He is not in acute distress.    Appearance: He is well-developed.  HENT:     Head: Normocephalic and atraumatic.  Eyes:     Conjunctiva/sclera: Conjunctivae normal.  Cardiovascular:     Rate and Rhythm: Normal rate and regular rhythm.     Heart sounds: No murmur heard. Pulmonary:     Effort: Pulmonary effort is normal. No respiratory distress.     Breath sounds: Normal breath sounds.  Abdominal:     Palpations: Abdomen is soft.     Tenderness: There is no abdominal tenderness.  Musculoskeletal:        General: No swelling.      Cervical back: Neck supple.     Comments: No calf tenderness to palpation, no lower extremity edema bilaterally  Skin:    General: Skin is warm and dry.     Capillary Refill: Capillary refill takes less than 2 seconds.  Neurological:     Mental Status: He is alert.  Psychiatric:        Mood and Affect: Mood normal.     ED Results / Procedures / Treatments   Labs (all labs ordered are listed, but only abnormal results are displayed) Labs Reviewed  CBC WITH DIFFERENTIAL/PLATELET - Abnormal; Notable for the following components:      Result Value   RBC 5.99 (*)    Hemoglobin 17.4 (*)    All other components within normal limits  COMPREHENSIVE METABOLIC PANEL - Abnormal; Notable for the following components:   Glucose, Bld 105 (*)    All other components within normal limits  TSH  TROPONIN I (HIGH SENSITIVITY)    EKG None  Radiology DG Chest 2 View Result Date: 06/15/2023 CLINICAL DATA:  Chest pain. EXAM: CHEST - 2 VIEW COMPARISON:  12/20/2020 FINDINGS: The lungs are clear without focal pneumonia, edema, pneumothorax or pleural effusion. The cardiopericardial silhouette is within normal  limits for size. No acute bony abnormality. IMPRESSION: No active cardiopulmonary disease. Electronically Signed   By: Camellia Candle M.D.   On: 06/15/2023 10:36    Procedures Procedures    Medications Ordered in ED Medications - No data to display  ED Course/ Medical Decision Making/ A&P                                 Medical Decision Making Amount and/or Complexity of Data Reviewed Labs: ordered. Radiology: ordered.     Differential diagnosis includes but is not limited to anxiety, hypothyroidism, hyperthyroidism, DVT, PE, ACS, arrhythmia, pneumonia  ED Course:  Patient well-appearing, no acute distress.  Vital signs stable.  Initial troponin of 2, EKG with normal sinus rhythm, given symptoms ongoing for 2 to 3 weeks with pain coming on in relation to his anxiety attacks, low  concern for ACS at this time.  Pain is not exertional, nonpleuritic.  PERC negative.  Low concern for DVT or PE at this time.  TSH within normal limits.  CBC and CMP unremarkable.  Chest x-ray with no acute abnormalities.  I suspect his chest pain may be related to his anxiety attacks and discussed that he should follow-up with his primary care provider within the next week for further management and evaluation.  Patient verbalized agreement.  Patient stable and appropriate for discharge home this time.  Impression: Atypical chest pain  Disposition:  The patient was discharged home with instructions to follow-up with PCP within the next week for further management. Return precautions given.   Imaging Studies ordered: I ordered imaging studies including chest x-ray I independently visualized the imaging with scope of interpretation limited to determining acute life threatening conditions related to emergency care. Imaging showed no acute abnormalities I agree with the radiologist interpretation   Cardiac Monitoring: / EKG: The patient was maintained on a cardiac monitor.  I personally viewed and interpreted the cardiac monitored which showed an underlying rhythm of: Normal sinus rhythm             Final Clinical Impression(s) / ED Diagnoses Final diagnoses:  Atypical chest pain    Rx / DC Orders ED Discharge Orders     None         Veta Palma, PA-C 06/15/23 1158    Elnor Savant A, DO 06/19/23 [phone]

## 2023-06-16 DIAGNOSIS — F419 Anxiety disorder, unspecified: Secondary | ICD-10-CM | POA: Diagnosis not present

## 2023-06-16 DIAGNOSIS — R030 Elevated blood-pressure reading, without diagnosis of hypertension: Secondary | ICD-10-CM | POA: Diagnosis not present

## 2023-06-16 DIAGNOSIS — M47812 Spondylosis without myelopathy or radiculopathy, cervical region: Secondary | ICD-10-CM | POA: Diagnosis not present

## 2023-06-16 DIAGNOSIS — G4700 Insomnia, unspecified: Secondary | ICD-10-CM | POA: Diagnosis not present

## 2023-06-22 ENCOUNTER — Encounter: Payer: Self-pay | Admitting: Nurse Practitioner

## 2023-06-22 ENCOUNTER — Emergency Department (HOSPITAL_COMMUNITY)
Admission: EM | Admit: 2023-06-22 | Discharge: 2023-06-22 | Disposition: A | Discharge status: Other Acute Inpatient Hospital | Payer: 59 | Attending: Emergency Medicine | Admitting: Emergency Medicine

## 2023-06-22 ENCOUNTER — Other Ambulatory Visit: Payer: Self-pay

## 2023-06-22 ENCOUNTER — Inpatient Hospital Stay
Admission: AD | Admit: 2023-06-22 | Discharge: 2023-06-25 | DRG: 885 | Disposition: A | Discharge status: Home / Self Care | Payer: 59 | Source: Intra-hospital | Attending: Psychiatry | Admitting: Psychiatry

## 2023-06-22 ENCOUNTER — Encounter (HOSPITAL_COMMUNITY): Payer: Self-pay

## 2023-06-22 DIAGNOSIS — F17290 Nicotine dependence, other tobacco product, uncomplicated: Secondary | ICD-10-CM | POA: Diagnosis present

## 2023-06-22 DIAGNOSIS — F333 Major depressive disorder, recurrent, severe with psychotic symptoms: Secondary | ICD-10-CM | POA: Diagnosis not present

## 2023-06-22 DIAGNOSIS — R823 Hemoglobinuria: Secondary | ICD-10-CM | POA: Insufficient documentation

## 2023-06-22 DIAGNOSIS — F332 Major depressive disorder, recurrent severe without psychotic features: Principal | ICD-10-CM | POA: Diagnosis present

## 2023-06-22 DIAGNOSIS — F411 Generalized anxiety disorder: Secondary | ICD-10-CM

## 2023-06-22 DIAGNOSIS — R519 Headache, unspecified: Secondary | ICD-10-CM | POA: Diagnosis not present

## 2023-06-22 DIAGNOSIS — R45851 Suicidal ideations: Secondary | ICD-10-CM

## 2023-06-22 DIAGNOSIS — I4581 Long QT syndrome: Secondary | ICD-10-CM | POA: Insufficient documentation

## 2023-06-22 DIAGNOSIS — G4700 Insomnia, unspecified: Secondary | ICD-10-CM | POA: Diagnosis not present

## 2023-06-22 DIAGNOSIS — Z8249 Family history of ischemic heart disease and other diseases of the circulatory system: Secondary | ICD-10-CM

## 2023-06-22 DIAGNOSIS — Z716 Tobacco abuse counseling: Secondary | ICD-10-CM | POA: Diagnosis not present

## 2023-06-22 DIAGNOSIS — R9431 Abnormal electrocardiogram [ECG] [EKG]: Secondary | ICD-10-CM

## 2023-06-22 DIAGNOSIS — Z888 Allergy status to other drugs, medicaments and biological substances status: Secondary | ICD-10-CM | POA: Diagnosis not present

## 2023-06-22 DIAGNOSIS — E876 Hypokalemia: Secondary | ICD-10-CM | POA: Diagnosis not present

## 2023-06-22 DIAGNOSIS — F418 Other specified anxiety disorders: Secondary | ICD-10-CM | POA: Insufficient documentation

## 2023-06-22 DIAGNOSIS — R440 Auditory hallucinations: Secondary | ICD-10-CM | POA: Diagnosis not present

## 2023-06-22 DIAGNOSIS — Z79899 Other long term (current) drug therapy: Secondary | ICD-10-CM | POA: Diagnosis not present

## 2023-06-22 LAB — CBC
HCT: 51.1 % (ref 39.0–52.0)
Hemoglobin: 17.3 g/dL — ABNORMAL HIGH (ref 13.0–17.0)
MCH: 29.4 pg (ref 26.0–34.0)
MCHC: 33.9 g/dL (ref 30.0–36.0)
MCV: 86.8 fL (ref 80.0–100.0)
Platelets: 203 10*3/uL (ref 150–400)
RBC: 5.89 MIL/uL — ABNORMAL HIGH (ref 4.22–5.81)
RDW: 12.6 % (ref 11.5–15.5)
WBC: 7.4 10*3/uL (ref 4.0–10.5)
nRBC: 0 % (ref 0.0–0.2)

## 2023-06-22 LAB — COMPREHENSIVE METABOLIC PANEL
ALT: 35 U/L (ref 0–44)
AST: 20 U/L (ref 15–41)
Albumin: 4.6 g/dL (ref 3.5–5.0)
Alkaline Phosphatase: 57 U/L (ref 38–126)
Anion gap: 11 (ref 5–15)
BUN: 7 mg/dL (ref 6–20)
CO2: 24 mmol/L (ref 22–32)
Calcium: 9.1 mg/dL (ref 8.9–10.3)
Chloride: 100 mmol/L (ref 98–111)
Creatinine, Ser: 0.96 mg/dL (ref 0.61–1.24)
GFR, Estimated: >60 mL/min (ref >60)
Glucose, Bld: 113 mg/dL — ABNORMAL HIGH (ref 70–99)
Potassium: 3.2 mmol/L — ABNORMAL LOW (ref 3.5–5.1)
Sodium: 135 mmol/L (ref 135–145)
Total Bilirubin: 0.5 mg/dL (ref 0.0–1.2)
Total Protein: 7.7 g/dL (ref 6.5–8.1)

## 2023-06-22 LAB — RAPID URINE DRUG SCREEN, HOSP PERFORMED
Amphetamines: NOT DETECTED
Barbiturates: NOT DETECTED
Benzodiazepines: NOT DETECTED
Cocaine: NOT DETECTED
Opiates: NOT DETECTED
Tetrahydrocannabinol: NOT DETECTED

## 2023-06-22 LAB — ETHANOL: Alcohol, Ethyl (B): <10 mg/dL (ref <10)

## 2023-06-22 MED ORDER — DIPHENHYDRAMINE HCL 50 MG/ML IJ SOLN: 50.0000 mg | Freq: Three times a day (TID) | INTRAMUSCULAR | Status: DC | PRN

## 2023-06-22 MED ORDER — POTASSIUM CHLORIDE CRYS ER 20 MEQ PO TBCR
30.0000 meq | EXTENDED_RELEASE_TABLET | Freq: Once | ORAL | Status: AC
  Administered 2023-06-22: 30 meq via ORAL
  Filled 2023-06-22: qty 1

## 2023-06-22 MED ORDER — LORAZEPAM 2 MG/ML IJ SOLN: 2.0000 mg | Freq: Three times a day (TID) | INTRAMUSCULAR | Status: DC | PRN

## 2023-06-22 MED ORDER — ACETAMINOPHEN 325 MG PO TABS: 650.0000 mg | ORAL_TABLET | Freq: Four times a day (QID) | ORAL | Status: DC | PRN

## 2023-06-22 MED ORDER — TRAZODONE HCL 50 MG PO TABS
50.0000 mg | ORAL_TABLET | Freq: Every evening | ORAL | Status: DC | PRN
  Administered 2023-06-22 – 2023-06-24 (×3): 50 mg via ORAL
  Filled 2023-06-22 (×3): qty 1

## 2023-06-22 MED ORDER — ALPRAZOLAM 0.25 MG PO TABS
0.2500 mg | ORAL_TABLET | Freq: Two times a day (BID) | ORAL | Status: DC | PRN
  Administered 2023-06-22: 0.25 mg via ORAL
  Filled 2023-06-22: qty 1

## 2023-06-22 MED ORDER — DIPHENHYDRAMINE HCL 25 MG PO CAPS: 50.0000 mg | ORAL_CAPSULE | Freq: Three times a day (TID) | ORAL | Status: DC | PRN

## 2023-06-22 MED ORDER — CLONAZEPAM 0.5 MG PO TABS
0.5000 mg | ORAL_TABLET | Freq: Every day | ORAL | Status: DC
  Administered 2023-06-23: 0.5 mg via ORAL
  Filled 2023-06-22: qty 1

## 2023-06-22 MED ORDER — HALOPERIDOL LACTATE 5 MG/ML IJ SOLN: 10.0000 mg | Freq: Three times a day (TID) | INTRAMUSCULAR | Status: DC | PRN

## 2023-06-22 MED ORDER — HALOPERIDOL LACTATE 5 MG/ML IJ SOLN: 5.0000 mg | Freq: Three times a day (TID) | INTRAMUSCULAR | Status: DC | PRN

## 2023-06-22 MED ORDER — MAGNESIUM HYDROXIDE 400 MG/5ML PO SUSP: 30.0000 mL | Freq: Every day | ORAL | Status: DC | PRN

## 2023-06-22 MED ORDER — HALOPERIDOL 5 MG PO TABS: 5.0000 mg | ORAL_TABLET | Freq: Three times a day (TID) | ORAL | Status: DC | PRN

## 2023-06-22 MED ORDER — ALUM & MAG HYDROXIDE-SIMETH 200-200-20 MG/5ML PO SUSP: 30.0000 mL | ORAL | Status: DC | PRN

## 2023-06-22 NOTE — ED Notes (Signed)
 Pt requesting to speak to psychiatrist. Julieanne Cotton notified

## 2023-06-22 NOTE — ED Notes (Signed)
Pt ambulated to the bathroom for urine sample.

## 2023-06-22 NOTE — Progress Notes (Signed)
 Pt was accepted to [facility] TODAY  06/22/2023; Bed Assignment 309 PEDNDING Signed Voluntary consent uploaded to chart or fax to BMU FAX Number [phone].  Address: [address] [place], [place], [place] [address]  Dx MDD.   Pt meets inpatient criteria per Gailen JAYSON Ivans, NP-PMHNP-BC    Attending Physician will be Dr. Charlie Cam HAS  Report can be called to: [phone]  Pt can arrive after: [facility] AC will coordinate with care team.  Care Team notified: Day [facility] Cherylynn Ernst, RN, Night [facility] Erica Wright,RN,Anthony Reeves, Gigi [place], [address] Ann OBIE Mae [place], [place],LCSWA, Josephine C [place], NP-PMHNP-BC    Mitzie GEANNIE Pinal, MSW, [facility] 06/22/2023 6:02 PM

## 2023-06-22 NOTE — Tx Team (Signed)
 Initial Treatment Plan 06/22/2023 8:43 PM Tahsin Benyo FMW:[id number]    PATIENT STRESSORS: Marital or family conflict   Medication change or noncompliance     PATIENT STRENGTHS: Average or above average intelligence  Capable of independent living    PATIENT IDENTIFIED PROBLEMS: Medication Change  Increased anxiety                   DISCHARGE CRITERIA:  Improved stabilization in mood, thinking, and/or behavior  PRELIMINARY DISCHARGE PLAN: Outpatient therapy  PATIENT/FAMILY INVOLVEMENT: This treatment plan has been presented to and reviewed with the patient, Derek Hoffman, and/or family member,  .  The patient and family have been given the opportunity to ask questions and make suggestions.  Joshua Hoose, RN 06/22/2023, 8:43 PM

## 2023-06-22 NOTE — ED Notes (Signed)
 Safe transport called to transport pt to [facility]

## 2023-06-22 NOTE — ED Triage Notes (Signed)
 Patient reports he started taking Cymbalta 5-6 days ago. Patient reports auditory hallucinations and SI without plan since starting it. Called PCP and told to come here.

## 2023-06-22 NOTE — Consult Note (Addendum)
 [facility] Psychiatric Consult Initial  Patient Name: .Derek Hoffman  MRN: [id number]  DOB: [birth year]  Consult Order details:  Orders (From admission, onward)     Start     Ordered   06/22/23 1236  CONSULT TO CALL ACT TEAM       Ordering Provider: Francis Ileana SAILOR, PA-C  Provider:  (Not yet assigned)  Question:  Reason for Consult?  Answer:  Psych consult   06/22/23 1235             Mode of Visit: In person    Psychiatry Consult Evaluation  Service Date: June 22, 2023 LOS:  LOS: 0 days  Chief Complaint AH, Suicide ideation  Primary Psychiatric Diagnoses  Recurrent Major Depressive disorder, severe with Psychotic Features 2.  GAD 3.  Suicide ideation.  Assessment  Derek Hoffman is a 33 y.o. male admitted: Presented to the EDfor 06/22/2023 10:20 AM for Auditory hallucination, Depression, anxiety and suicide ideation. He carries the psychiatric diagnoses of Depression, anxiety and has a past medical history of  Post concussive Syndrome.   His current presentation of Auditory hallucination, anxiety and suicidal ideation is most consistent with Depression and recent change in antidepressant. He meets criteria for inpatient Psychiatry hospitalization based on symptoms reported..  Current outpatient psychotropic medications include Cymbalta, Klonopin  and historically he has had a negative response to these medications. He was  compliant with medications prior to admission as evidenced by report given. On initial examination, patient was seen anxious and worried.. Please see plan below for detailed recommendations.   Diagnoses:  Active Hospital problems: Principal Problem:   Severe recurrent major depressive disorder with psychotic features (HCC) Active Problems:   Generalized anxiety disorder    Plan   ## Psychiatric Medication Recommendations:  Start Lexapro  10 mg po daily  ( on hold due to QTC INTERVAL 534) Hydroxyzine  25 mg po tid for anxiety  ## Medical Decision  Making Capacity:  Patient is his own Legal Guardian  ## Further Work-up:   TSH, B12, folate -- most recent EKG on 06/22/2023 had QtC of 534 -- Pertinent labwork reviewed earlier this admission includes: CMP, CBC, UDS   ## Disposition:-- We recommend inpatient psychiatric hospitalization when medically cleared. Patient is under voluntary admission status at this time; please IVC if attempts to leave hospital.  ## Behavioral / Environmental: -Utilize compassion and acknowledge the patient's experiences while setting clear and realistic expectations for care.    ## Safety and Observation Level:  - Based on my clinical evaluation, I estimate the patient to be at Low  risk of self harm in the current setting. - At this time, we recommend  routine. This decision is based on my review of the chart including patient's history and current presentation, interview of the patient, mental status examination, and consideration of suicide risk including evaluating suicidal ideation, plan, intent, suicidal or self-harm behaviors, risk factors, and protective factors. This judgment is based on our ability to directly address suicide risk, implement suicide prevention strategies, and develop a safety plan while the patient is in the clinical setting. Please contact our team if there is a concern that risk level has changed.  CSSR Risk Category:C-SSRS RISK CATEGORY: Low Risk  Suicide Risk Assessment: Patient has following modifiable risk factors for suicide: active suicidal ideation, untreated depression, and under treated depression , which we are addressing by recommending inpatient Psychiatry hospitalization. Patient has following non-modifiable or demographic risk factors for suicide: male gender Patient has the following protective factors  against suicide: Access to outpatient mental health care, Supportive family, Supportive friends, and no history of suicide attempts  Thank you for this consult request.  Recommendations have been communicated to the primary team.  We will Seek inpatient Psychiatry hospitalization.  We will fax out records for bed placement. at this time.   [name] C Aissata Wilmore, NP-PMHNP-BC       History of Present Illness  Relevant Aspects of Hospital ED Course:  Admitted on 06/22/2023 for AH, Anxiety, Depression and suicide ideation. .  Male, 2 years old was seen in the ER brought in by his friend for anxiety, depression, auditory hallucination and suicide ideation.   Patient reports he started Cymbalta six days ago after visit to to [facility] .  Patient reports that since he started taking Cymbalta he has been suffering from insomnia, loss of appetite, Auditory hallucination with voices telling him to kill himself.  Patient reports that he was diagnosed with depression since age 75 and he was doing well on Celexa .  He stopped Celexa  20 mg for a period of eight months.  After neck and knee Surgery he started feeling anxious and depressed again an he went back to Celexa .  His dose of Celexa  was increased to 40 mg daily but he was not feeling better after two weeks and he went to see his provider who then took him off Celexa  and started Cymbalta.  For six days he started hearing voices telling him to kill himself.  He has not been sleeping or eating well. Patient rated Depression and anxiety 9/10 with 10 being severe depression or anxiety. Patient reports thinking a lot with his mind not resting. Patient reports his stressor are work related and recent Neck and knee surgery that is not working well.  Patient also  has strong family mental health issues. We reviewed option for care based on the [facility] and the suicide ideation.  Patient is agreeable to few days of inpatient Psychiatry hospitalization.  We discussed switching him to Lexapro  for anxiety and depression.  His QTC Interval today is 534.  We reviewed various coping skills to use when stressed out.  Patient admitted smokes Cannabis at  times for stress but his UDS is negative.  We will seek bed placement at any facility with available bed.    Psych ROS:  Depression: yes Anxiety:  yes Mania (lifetime and current): no Psychosis: (lifetime and current): yes-Auditory hallucination  Collateral information: Patient and a male friend in the room participated in the interview.   Review of Systems  Constitutional: Negative.   HENT: Negative.    Eyes: Negative.   Respiratory: Negative.    Cardiovascular: Negative.   Gastrointestinal: Negative.   Genitourinary: Negative.   Musculoskeletal: Negative.   Skin: Negative.   Neurological: Negative.   Endo/Heme/Allergies: Negative.   Psychiatric/Behavioral:  Positive for depression, hallucinations and suicidal ideas. The patient is nervous/anxious and has insomnia.      Psychiatric and Social History  Psychiatric History:  Information collected from Patient/Male Friend  Prev Dx/Sx: Depression, Anxiety Current Psych Provider: [facility] Meds (current): Cymbalta, Klonopin  Previous Med Trials: Celexa , Cymbalta Therapy: About to start soon   Prior Psych Hospitalization: denies  Prior Self Harm: denies Prior Violence: denies  Family Psych History: yes-Paternal Grandfather suffered from Mental illness and was hospitalized several times.  Dad-Anxiety.  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 Legal Hx: denies Living Situation: Lives in his apartment Spiritual Hx: denies Access to weapons/lethal means: denies   Substance History Alcohol : Denies  Type of alcohol  na Last Drink na Number of drinks per day na History of alcohol  withdrawal seizures na History of DT's na Tobacco: vape Cigarette Illicit drugs: Smokes Cannabis 10 times a  month Prescription drug abuse: denies Rehab hx: denies  Exam Findings  Physical Exam:  Vital Signs:  Temp:  [97.8 F (36.6 C)] 97.8 F (36.6 C) (01/13 1023) Pulse Rate:  [87] 87 (01/13 1023) Resp:  [17] 17 (01/13 1023) BP: (141)/(109) 141/109 (01/13 1023) SpO2:  [99 %] 99 % (01/13 1023) Weight:  [111.1 kg] 111.1 kg (01/13 1036) Blood pressure (!) 141/109, pulse 87, temperature 97.8 F (36.6 C), temperature source Oral, resp. rate 17, height 5' 11 (1.803 m), weight 111.1 kg, SpO2 99%. Body mass index is 34.17 kg/m.  Physical Exam Constitutional:      Appearance: Normal appearance. He is obese.  HENT:     Nose: Nose normal.  Pulmonary:     Effort: Pulmonary effort is normal.  Musculoskeletal:        General: Normal range of motion.  Skin:    General: Skin is dry.  Neurological:     Mental Status: He is alert and oriented to person, place, and time.  Psychiatric:        Attention and Perception: Attention normal. He perceives auditory hallucinations.        Mood and Affect: Mood is anxious and depressed.        Speech: Speech normal.        Behavior: Behavior normal. Behavior is cooperative.        Thought Content: Thought content includes suicidal ideation.        Cognition and Memory: Cognition and memory normal.        Judgment: Judgment normal.     Mental Status Exam: General Appearance: Casual  Orientation:  Full (Time, Place, and Person)  Memory:  Immediate;   Good Recent;   Good Remote;   Good  Concentration:  Concentration: Good and Attention Span: Good  Recall:  Good  Attention  Good  Eye Contact:  Good  Speech:  Clear and Coherent  Language:  Good  Volume:  Normal  Mood: anxious Depression  Affect:  Congruent and Depressed  Thought Process:  Coherent and Goal Directed  Thought Content:  Logical  Suicidal Thoughts:  Yes.  without intent/plan  Homicidal Thoughts:  No  Judgement:  Good  Insight:  Good  Psychomotor Activity:  Normal  Akathisia:  NA   Fund of Knowledge:  Good      Assets:  Communication Skills Desire for Improvement Financial Resources/Insurance Housing Intimacy Physical Health  Cognition:  WNL  ADL's:  Intact  AIMS (if indicated):        Other History   These have been pulled in through the EMR, reviewed, and updated if appropriate.  Family History:  The patient's family history includes Hypertension in his father.  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   Depression    History of MRSA infection 2015   left arm   Lateral meniscus tear 12/2014   left    Surgical History: Past Surgical History:  Procedure Laterality Date   CHONDROPLASTY Left 01/11/2015   Procedure: CHONDROPLASTY;  Surgeon: Toribio JULIANNA Chancy, MD;  Location: [facility];  Service: Orthopedics;  Laterality: Left;   FEMUR HARDWARE REMOVAL Left 10/28/2007   KNEE ARTHROSCOPY Left 10/28/2007   with manipulation   KNEE ARTHROSCOPY WITH EXCISION PLICA Left 01/11/2015   Procedure: KNEE ARTHROSCOPY WITH EXCISION PLICA;  Surgeon: Toribio JULIANNA Chancy, MD;  Location: [facility];  Service: Orthopedics;  Laterality: Left;   KNEE ARTHROSCOPY WITH LATERAL MENISECTOMY Left 01/11/2015   Procedure: LEFT KNEE ARTHROSCOPY  WITH LATERAL MENISCECTOMY, EXCISION OF MEDIAL PLICA, CHONDROPLASTY;  Surgeon: Toribio JULIANNA Chancy, MD;  Location: [facility];  Service: Orthopedics;  Laterality: Left;   NASAL SEPTOPLASTY W/ TURBINOPLASTY Bilateral 10/08/2020   Procedure: REVISION NASAL SEPTOPLASTY WITH TURBINATE REDUCTION AND SEPTUM REVISION;  Surgeon: Karis Clunes, MD;  Location: [facility];  Service: ENT;  Laterality: Bilateral;   ORIF FEMUR FRACTURE Left    SEPTOPLASTY     SHOULDER ARTHROSCOPY WITH ROTATOR CUFF REPAIR Left 06/27/2021   Procedure: Left Shoulder arthroscopy, debridement, subacromial decompression, distal clavicle resection,;  Surgeon: Melita Drivers,  MD;  Location: [facility];  Service: Orthopedics;  Laterality: Left;    TONSILLECTOMY AND ADENOIDECTOMY     WISDOM TOOTH EXTRACTION       Medications:  No current facility-administered medications for this encounter.  Current Outpatient Medications:    ALPRAZolam  (XANAX ) 0.25 MG tablet, Take 0.125-0.25 mg by mouth daily as needed., Disp: , Rfl:    clonazePAM  (KLONOPIN ) 0.5 MG tablet, Take 0.5 mg by mouth daily., Disp: , Rfl:    gabapentin (NEURONTIN) 300 MG capsule, 1-2 po qhs, Disp: , Rfl:    citalopram  (CELEXA ) 20 MG tablet, take 1 tablet by mouth once daily, Disp: 30 tablet, Rfl: 0   hyoscyamine  (LEVSIN  SL) 0.125 MG SL tablet, Place 1 tablet (0.125 mg total) under the tongue every 6 (six) hours as needed., Disp: 30 tablet, Rfl: 2  Allergies: Allergies  Allergen Reactions   Cymbalta [Duloxetine Hcl] Other (See Comments)    Hallucination, suicide ideation.    Jenna Ardoin C Hendrix Console, NP-PMHNP-BC

## 2023-06-22 NOTE — Progress Notes (Signed)
 Patient admitted to unit. Alert and orient x4. Complaints of anxiety and depression, states was started on cymbalta and the medication was the cause of his increased depression, insomnia and decreased appetite. Patient denies SI, HI, AVH. Would like to get started on new medication for depression. Was on celexa  40 mg before. Patient is pleasant and cooperative with care. Oriented patient to room and unit. Skin and contraband search completed and witnessed by Shanda Peak. No contraband found no skin issues noted. Fluid and nutrition offered. Encouragement and support provided. Safety checks maintained. Medications given as prescribed. Pt receptive and remains safe on unit with q 15 min checks.

## 2023-06-22 NOTE — ED Notes (Signed)
 Report to Gigi at [facility]

## 2023-06-22 NOTE — ED Provider Notes (Signed)
 [facility] AT [facility] Provider Note   CSN: [id number] Arrival date & time: 06/22/23  1016     History  Chief Complaint  Patient presents with   Hallucinations    Derek Hoffman is a 33 y.o. male presents today for suicidal ideation and auditory hallucinations.  Patient states he started taking Cymbalta for anxiety and depression approximately 5 to 6 days ago and the hallucinations started shortly after.  Patient states that the auditory hallucinations are telling him to do it to which he believes means to commit suicide.  Patient denies plan at this time.  Patient also states that he has a mild headache and has been sleeping less and got approximately 4 to 5 hours of sleep last night.  Patient has no other complaints at this time.  Patient endorses vape use.  Patient denies alcohol  or other drug use.  HPI     Home Medications Prior to Admission medications   Medication Sig Start Date End Date Taking? Authorizing Provider  citalopram  (CELEXA ) 20 MG tablet take 1 tablet by mouth once daily 06/12/12   Paz, Jose E, MD  hyoscyamine  (LEVSIN  SL) 0.125 MG SL tablet Place 1 tablet (0.125 mg total) under the tongue every 6 (six) hours as needed. 11/27/22   Zehr, Jessica D, PA-C      Allergies    Patient has no known allergies.    Review of Systems   Review of Systems  Neurological:  Positive for headaches.  Psychiatric/Behavioral:  Positive for hallucinations, sleep disturbance and suicidal ideas.     Physical Exam Updated Vital Signs BP (!) 141/109 (BP Location: Left Arm)   Pulse 87   Temp 97.8 F (36.6 C) (Oral)   Resp 17   Ht 5' 11 (1.803 m)   Wt 111.1 kg   SpO2 99%   BMI 34.17 kg/m  Physical Exam Vitals and nursing note reviewed.  Constitutional:      General: He is not in acute distress.    Appearance: Normal appearance. He is well-developed.  HENT:     Head: Normocephalic and atraumatic.     Right Ear: External ear normal.      Left Ear: External ear normal.     Nose: Nose normal.  Eyes:     Conjunctiva/sclera: Conjunctivae normal.  Cardiovascular:     Rate and Rhythm: Normal rate and regular rhythm.     Pulses: Normal pulses.     Heart sounds: Normal heart sounds. No murmur heard. Pulmonary:     Effort: Pulmonary effort is normal. No respiratory distress.     Breath sounds: Normal breath sounds.  Abdominal:     Palpations: Abdomen is soft.     Tenderness: There is no abdominal tenderness.  Musculoskeletal:        General: No swelling. Normal range of motion.     Cervical back: Neck supple.  Skin:    General: Skin is warm and dry.     Capillary Refill: Capillary refill takes less than 2 seconds.  Neurological:     General: No focal deficit present.     Mental Status: He is alert.  Psychiatric:        Attention and Perception: Attention normal. He perceives auditory hallucinations.        Mood and Affect: Mood normal.        Speech: Speech normal.        Behavior: Behavior normal. Behavior is cooperative.  Thought Content: Thought content includes suicidal ideation.     ED Results / Procedures / Treatments   Labs (all labs ordered are listed, but only abnormal results are displayed) Labs Reviewed  COMPREHENSIVE METABOLIC PANEL - Abnormal; Notable for the following components:      Result Value   Potassium 3.2 (*)    Glucose, Bld 113 (*)    All other components within normal limits  CBC - Abnormal; Notable for the following components:   RBC 5.89 (*)    Hemoglobin 17.3 (*)    All other components within normal limits  ETHANOL  RAPID URINE DRUG SCREEN, HOSP PERFORMED    EKG EKG Interpretation Date/Time:  Monday June 22 2023 12:06:29 EST Ventricular Rate:  97 PR Interval:  134 QRS Duration:  99 QT Interval:  420 QTC Calculation: 534 R Axis:   7  Text Interpretation: Sinus rhythm Borderline T wave abnormalities Prolonged QT interval Otherwise no significant change Confirmed by  Ellouise Fine ([id number]) on 06/22/2023 12:34:19 PM  Radiology No results found.  Procedures Procedures    Medications Ordered in ED Medications  potassium chloride  (KLOR-CON  M) CR tablet 30 mEq (has no administration in time range)    ED Course/ Medical Decision Making/ A&P                                 Medical Decision Making Amount and/or Complexity of Data Reviewed Labs: ordered.   This patient presents to the ED with chief complaint(s) of hallucinations with pertinent past medical history of anxiety and depression which further complicates the presenting complaint. The complaint involves an extensive differential diagnosis and also carries with it a high risk of complications and morbidity.    The differential diagnosis includes suicidal ideation, homicidal ideation, anxiety, depression  Additional history obtained: Records reviewed Care Everywhere/External Records  ED Course and Reassessment: Patient had syncopal episode during blood draw.  Patient does state he has not eaten anything today.  EKG ordered to rule out cardiac arrhythmia. Patient given 30 mEq of oral potassium  Independent labs interpretation:  The following labs were independently interpreted:  CBC: Mildly elevated hemoglobin CMP: Mildly low potassium UDS: Negative Ethanol: <10 EKG: Sinus rhythm, Borderline T wave abnormalities, Prolonged QT interval  Consultation: - Consulted or discussed management/test interpretation w/ external professional: Consult TTS  Consideration for admission or further workup: Considered for mission further However patient's vital signs, physical exam, and labs were reassuring.  I suspect patient's mildly prolonged QT interval is due to Cymbalta use given this is a new finding when comparing to prior EKGs most recently EKG performed on 06/15/2023.  Patient given oral potassium while in ER and should follow-up with PCP in approximately 1 week after discontinuing Cymbalta.   Patient's dispo will be determined by TTS.        Final Clinical Impression(s) / ED Diagnoses Final diagnoses:  Prolonged Q-T interval on ECG    Rx / DC Orders ED Discharge Orders     None         Francis Ileana SAILOR, PA-C 06/22/23 1248    Ellouise Fine K, DO 06/22/23 1356

## 2023-06-23 ENCOUNTER — Ambulatory Visit (INDEPENDENT_AMBULATORY_CARE_PROVIDER_SITE_OTHER): Payer: 59 | Admitting: Mental Health

## 2023-06-23 DIAGNOSIS — Z0389 Encounter for observation for other suspected diseases and conditions ruled out: Secondary | ICD-10-CM

## 2023-06-23 DIAGNOSIS — F411 Generalized anxiety disorder: Secondary | ICD-10-CM

## 2023-06-23 MED ORDER — HYDROXYZINE HCL 50 MG PO TABS: 50.0000 mg | ORAL_TABLET | Freq: Three times a day (TID) | ORAL | Status: DC | PRN

## 2023-06-23 MED ORDER — ESCITALOPRAM OXALATE 10 MG PO TABS
10.0000 mg | ORAL_TABLET | Freq: Every day | ORAL | Status: DC
Start: 2023-06-24 — End: 2023-06-25
  Administered 2023-06-24 – 2023-06-25 (×2): 10 mg via ORAL
  Filled 2023-06-23 (×2): qty 1

## 2023-06-23 MED ORDER — NICOTINE POLACRILEX 2 MG MT GUM
2.0000 mg | CHEWING_GUM | OROMUCOSAL | Status: DC | PRN
  Administered 2023-06-23 – 2023-06-24 (×4): 2 mg via ORAL
  Filled 2023-06-23 (×4): qty 1

## 2023-06-23 NOTE — Group Note (Signed)
 Date:  06/23/2023 Time:  10:13 AM  Group Topic/Focus:  Goals Group:   The focus of this group is to help patients establish daily goals to achieve during treatment and discuss how the patient can incorporate goal setting into their daily lives to aide in recovery.    Participation Level:  Active  Participation Quality:  Appropriate  Affect:  Appropriate  Cognitive:  Appropriate  Insight: Appropriate  Engagement in Group:  Engaged  Modes of Intervention:  Discussion, Education, and Support  Additional Comments:    Deitra Caron Mainland 06/23/2023, 10:13 AM

## 2023-06-23 NOTE — Plan of Care (Signed)
  Problem: Education: Goal: Emotional status will improve Outcome: Progressing Goal: Mental status will improve Outcome: Progressing   Problem: Education: Goal: Mental status will improve Outcome: Progressing   

## 2023-06-23 NOTE — Group Note (Signed)
 Recreation Therapy Group Note   Group Topic:Relaxation  Group Date: 06/23/2023 Start Time: 1000 End Time: 1050 Facilitators: Celestia Jeoffrey BRAVO, LRT, CTRS Location:  Craft Room  Group Description: PMR (Progressive Muscle Relaxation). LRT asks patients their current level of stress/anxiety from 1-10, with 10 being the highest. LRT educates patients on what PMR is and the benefits that come from it. Patients are asked to sit with their feet flat on the floor while sitting up and all the way back in their chair, if possible. LRT and pts follow a prompt through a speaker that requires you to tense and release different muscles in their body and focus on their breathing. During session, lights are off and soft music is being played. Pts are given a stress ball to use if needed. At the end of the prompt, LRT asks patients to rank their current levels of stress/anxiety from 1-10, 10 being the highest. LRT provides patients with an education handout on PMR.   Goal Area(s) Addressed:  Patients will be able to describe progressive muscle relaxation.  Patient will practice using relaxation technique. Patient will identify a new coping skill.  Patient will follow multistep directions to reduce anxiety and stress.   Affect/Mood: Appropriate   Participation Level: Active and Engaged   Participation Quality: Independent   Behavior: Appropriate, Calm, and Cooperative   Speech/Thought Process: Coherent   Insight: Good   Judgement: Good   Modes of Intervention: Activity, Education, Exploration, and Guided Discussion   Patient Response to Interventions:  Attentive, Engaged, and Requested additional information/resources    Education Outcome:  Acknowledges education   Clinical Observations/Individualized Feedback: Zane was active in their participation of session activities and group discussion. Pt identified that his anxiety was a 9 and stress was a 8 before the session. Afterwards, he rated  his anxiety a 5 and stress a 4. Pt requested and received a stress ball after group. Pt interacted well with LRT and peers duration of session.    Plan: Continue to engage patient in RT group sessions 2-3x/week.   Jeoffrey BRAVO Celestia, LRT, CRS 06/23/2023 11:42 AM

## 2023-06-23 NOTE — Group Note (Signed)
 LCSW Group Therapy Note  Group Date: 06/23/2023 Start Time: 1300 End Time: 1405   Type of Therapy and Topic:  Group Therapy - Healthy vs Unhealthy Coping Skills  Participation Level:  Active   Description of Group The focus of this group was to determine what unhealthy coping techniques typically are used by group members and what healthy coping techniques would be helpful in coping with various problems. Patients were guided in becoming aware of the differences between healthy and unhealthy coping techniques. Patients were asked to identify 2-3 healthy coping skills they would like to learn to use more effectively.  Therapeutic Goals Patients learned that coping is what human beings do all day long to deal with various situations in their lives Patients defined and discussed healthy vs unhealthy coping techniques Patients identified their preferred coping techniques and identified whether these were healthy or unhealthy Patients determined 2-3 healthy coping skills they would like to become more familiar with and use more often. Patients provided support and ideas to each other   Summary of Patient Progress:  During group, Patient expressed willingly. Patient proved open to input from peers and feedback from CSW. Patient demonstrated proficient insight into the subject matter, was respectful of peers, and participated throughout the entire session.   Therapeutic Modalities Cognitive Behavioral Therapy Motivational Interviewing  Deldrick Linch M Lorelie Biermann, LCSWA 06/23/2023  3:04 PM

## 2023-06-23 NOTE — Progress Notes (Signed)
 Canceled appt 06/23/23 due to patient being on the psychiatric unit.  No fee applied.
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
## 2023-06-23 NOTE — Group Note (Signed)
 Recreation Therapy Group Note   Group Topic:Health and Wellness  Group Date: 06/23/2023 Start Time: 1530 End Time: 1600 Facilitators: Celestia Jeoffrey BRAVO, LRT, CTRS Location:  Dayroom  Group Description: Seated Exercise. LRT discussed the mental and physical benefits of exercise. LRT and group discussed how physical activity can be used as a coping skill. Pt's and LRT followed along to an exercise video on the TV screen that provided a visual representation and audio description of every exercise performed. Pt's encouraged to listen to their bodies and stop at any time if they experience feelings of discomfort or pain. Pts were encouraged to drink water and stay hydrated.   Goal Area(s) Addressed: Patient will learn benefits of physical activity. Patient will identify exercise as a coping skill.  Patient will follow multistep directions. Patient will try a new leisure interest.    Affect/Mood: N/A   Participation Level: Did not attend    Clinical Observations/Individualized Feedback: Patient did not attend group.   Plan: Continue to engage patient in RT group sessions 2-3x/week.   Jeoffrey BRAVO Celestia, LRT, CTRS 06/23/2023 5:34 PM
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
## 2023-06-23 NOTE — Group Note (Signed)
 Date:  06/23/2023 Time:  8:54 PM  Group Topic/Focus:  Wrap-Up Group:   The focus of this group is to help patients review their daily goal of treatment and discuss progress on daily workbooks.    Participation Level:  Active  Participation Quality:  Appropriate and Attentive  Affect:  Appropriate  Cognitive:  Alert and Appropriate  Insight: Appropriate  Engagement in Group:  Engaged  Modes of Intervention:  Discussion and Orientation  Additional Comments:     Maglione,Yeimi Debnam E 06/23/2023, 8:54 PM

## 2023-06-23 NOTE — B[facility] Suicide Risk Assessment (Signed)
 [facility] INPATIENT:  Family/Significant Other Suicide Prevention Education  Suicide Prevention Education:  Education Completed; Derek Hoffman, mother, [phone] has been identified by the patient as the family member/significant other with whom the patient will be residing, and identified as the person(s) who will aid the patient in the event of a mental health crisis (suicidal ideations/suicide attempt).  With written consent from the patient, the family member/significant other has been provided the following suicide prevention education, prior to the and/or following the discharge of the patient.  The suicide prevention education provided includes the following: Suicide risk factors Suicide prevention and interventions National Suicide Hotline telephone number [facility] assessment telephone number [facility] Emergency Assistance 911 [facility] and/or Residential Mobile Crisis Unit telephone number  Request made of family/significant other to: Remove weapons (e.g., guns, rifles, knives), all items previously/currently identified as safety concern.   Remove drugs/medications (over-the-counter, prescriptions, illicit drugs), all items previously/currently identified as a safety concern.  The family member/significant other verbalizes understanding of the suicide prevention education information provided.  The family member/significant other agrees to remove the items of safety concern listed above.  Derek Hoffman 06/23/2023, 2:45 PM

## 2023-06-23 NOTE — Progress Notes (Signed)
   06/23/23 1038  Psych Admission Type (Psych Patients Only)  Admission Status Voluntary  Psychosocial Assessment  Patient Complaints Anxiety  Eye Contact Fair  Facial Expression Flat  Affect Flat  Speech Logical/coherent  Interaction Assertive  Motor Activity Slow  Appearance/Hygiene In scrubs  Behavior Characteristics Cooperative  Mood Depressed  Thought Process  Coherency WDL  Content WDL  Delusions None reported or observed  Perception WDL  Hallucination None reported or observed  Judgment WDL  Confusion None  Danger to Self  Current suicidal ideation? Denies  Danger to Others  Danger to Others None reported or observed

## 2023-06-23 NOTE — BHH Counselor (Signed)
 CSW attempted to call [facility] to alert that patient will miss appointment due to admission.  CSW left HIPAA compliant voicemail.  Penni Homans, MSW, LCSW 06/23/2023 10:19 AM

## 2023-06-23 NOTE — Group Note (Signed)
 Date:  06/23/2023 Time:  12:54 AM  Group Topic/Focus:  Wrap-Up Group:   The focus of this group is to help patients review their daily goal of treatment and discuss progress on daily workbooks.    Participation Level:  Active  Participation Quality:  Appropriate  Affect:  Appropriate  Cognitive:  Alert  Insight: Appropriate  Engagement in Group:  Engaged  Modes of Intervention:  Discussion  Additional Comments:    Derek Hoffman 06/23/2023, 12:54 AM

## 2023-06-24 MED ORDER — CLONAZEPAM 0.5 MG PO TABS
0.5000 mg | ORAL_TABLET | Freq: Every day | ORAL | Status: DC | PRN
Start: 2023-06-24 — End: 2023-06-25

## 2023-06-24 NOTE — Progress Notes (Signed)
   06/24/23 1400  Psych Admission Type (Psych Patients Only)  Admission Status Voluntary  Psychosocial Assessment  Patient Complaints Anxiety  Eye Contact Fair  Facial Expression Flat  Affect Flat  Speech Logical/coherent  Interaction Assertive  Motor Activity Slow  Appearance/Hygiene In scrubs  Behavior Characteristics Cooperative  Mood Depressed  Thought Process  Coherency WDL  Content WDL  Delusions None reported or observed  Perception WDL  Hallucination None reported or observed  Judgment WDL  Confusion None  Danger to Self  Current suicidal ideation? Denies  Danger to Others  Danger to Others None reported or observed

## 2023-06-24 NOTE — Group Note (Deleted)
 Date:  06/24/2023 Time:  11:15 AM  Group Topic/Focus:  Self Esteem Action Plan:   The focus of this group is to help patients create a plan to continue to build self-esteem after discharge.     Participation Level:  {BHH PARTICIPATION XWRUE:45409}  Participation Quality:  {BHH PARTICIPATION QUALITY:22265}  Affect:  {BHH AFFECT:22266}  Cognitive:  {BHH COGNITIVE:22267}  Insight: {BHH Insight2:20797}  Engagement in Group:  {BHH ENGAGEMENT IN WJXBJ:47829}  Modes of Intervention:  {BHH MODES OF INTERVENTION:22269}  Additional Comments:  ***  Dow Gemma 06/24/2023, 11:15 AM

## 2023-06-24 NOTE — BHH Counselor (Signed)
 CSW spoke with Edwina Gram at [facility] and canceled patients appointment.  Shasta Deist, MSW, LCSW 06/24/2023 3:30 PM

## 2023-06-24 NOTE — Group Note (Signed)
 Date:  06/24/2023 Time:  10:02 PM  Group Topic/Focus:  Wellness Toolbox:   The focus of this group is to discuss various aspects of wellness, balancing those aspects and exploring ways to increase the ability to experience wellness.  Patients will create a wellness toolbox for use upon discharge.    Participation Level:  Active  Participation Quality:  Appropriate, Attentive, Sharing, and Supportive  Affect:  Appropriate  Cognitive:  Appropriate  Insight: Appropriate and Good  Engagement in Group:  Supportive  Modes of Intervention:  Discussion and Support  Additional Comments:     Derek Hoffman 06/24/2023, 10:02 PM

## 2023-06-24 NOTE — Group Note (Signed)
 [facility] LCSW Group Therapy Note   Group Date: 06/24/2023 Start Time: 1300 End Time: 1345   Type of Therapy/Topic:  Group Therapy:  Emotion Regulation  Participation Level:  Active   Description of Group:    The purpose of this group is to assist patients in learning to regulate negative emotions and experience positive emotions. Patients will be guided to discuss ways in which they have been vulnerable to their negative emotions. These vulnerabilities will be juxtaposed with experiences of positive emotions or situations, and patients challenged to use positive emotions to combat negative ones. Special emphasis will be placed on coping with negative emotions in conflict situations, and patients will process healthy conflict resolution skills.  Therapeutic Goals: Patient will identify two positive emotions or experiences to reflect on in order to balance out negative emotions:  Patient will label two or more emotions that they find the most difficult to experience:  Patient will be able to demonstrate positive conflict resolution skills through discussion or role plays:   Summary of Patient Progress: Patient was present for the entirety of the group process. He was actively involved in the discussion, identifying anxiety and anger as strong emotions that he was feeling or had felt prior to admission. His heart racing was noted as a physical symptom of this strong emotion. Work and relationships were noted as triggers for strong emotions. He shared that punching stuff has been an unproductive reaction to said emotions. Pt appears to have some insight into the topic and himself. He appeared open and receptive to feedback/comments from both his peers and facilitator.    Therapeutic Modalities:   Cognitive Behavioral Therapy Feelings Identification Dialectical Behavioral Therapy   Randolm Butte, LCSW

## 2023-06-24 NOTE — Group Note (Unsigned)
 Date:  06/24/2023 Time:  9:42 PM  Group Topic/Focus:  Wellness Toolbox:   The focus of this group is to discuss various aspects of wellness, balancing those aspects and exploring ways to increase the ability to experience wellness.  Patients will create a wellness toolbox for use upon discharge.     Participation Level:  {BHH PARTICIPATION BJYNW:29562}  Participation Quality:  {BHH PARTICIPATION QUALITY:22265}  Affect:  {BHH AFFECT:22266}  Cognitive:  {BHH COGNITIVE:22267}  Insight: {BHH Insight2:20797}  Engagement in Group:  {BHH ENGAGEMENT IN ZHYQM:57846}  Modes of Intervention:  {BHH MODES OF INTERVENTION:22269}  Additional Comments:  ***  Derek Hoffman 06/24/2023, 9:42 PM

## 2023-06-24 NOTE — Plan of Care (Signed)
  Problem: Activity: Goal: Interest or engagement in activities will improve Outcome: Progressing Goal: Sleeping patterns will improve Outcome: Progressing   Problem: Education: Goal: Knowledge of [place] [facility] Education information/materials will improve Outcome: Progressing Goal: Emotional status will improve Outcome: Progressing

## 2023-06-24 NOTE — Plan of Care (Signed)
   Problem: Education: Goal: Knowledge of [place] [facility] Education information/materials will improve Outcome: Progressing Goal: Emotional status will improve Outcome: Progressing Goal: Mental status will improve Outcome: Progressing

## 2023-06-24 NOTE — Progress Notes (Signed)
   06/24/23 0100  Psych Admission Type (Psych Patients Only)  Admission Status Voluntary  Psychosocial Assessment  Patient Complaints Anxiety  Eye Contact Fair  Facial Expression Flat  Affect Flat  Speech Logical/coherent  Interaction Assertive  Motor Activity Slow  Appearance/Hygiene In scrubs  Behavior Characteristics Cooperative  Mood Depressed  Aggressive Behavior  Effect No apparent injury  Thought Process  Coherency WDL  Content WDL  Delusions None reported or observed  Perception WDL  Hallucination None reported or observed  Judgment WDL  Confusion None  Danger to Self  Current suicidal ideation?  (Denies)  Danger to Others  Danger to Others None reported or observed

## 2023-06-24 NOTE — BH IP Treatment Plan (Signed)
 Interdisciplinary Treatment and Diagnostic Plan Update  06/24/2023 Time of Session: 09:16 Derek Hoffman MRN: [id number]  Principal Diagnosis: Major depressive disorder, recurrent severe without psychotic features (HCC)  Secondary Diagnoses: Principal Problem:   Major depressive disorder, recurrent severe without psychotic features (HCC)   Current Medications:  Current Facility-Administered Medications  Medication Dose Route Frequency Provider Last Rate Last Admin   acetaminophen  (TYLENOL ) tablet 650 mg  650 mg Oral Q6H PRN Onuoha, Josephine C, NP       alum & mag hydroxide-simeth (MAALOX/MYLANTA) 200-200-20 MG/5ML suspension 30 mL  30 mL Oral Q4H PRN Onuoha, Josephine C, NP       clonazePAM  (KLONOPIN ) tablet 0.5 mg  0.5 mg Oral Daily PRN Remington, Amber E, NP       haloperidol  (HALDOL ) tablet 5 mg  5 mg Oral TID PRN Onuoha, Josephine C, NP       And   diphenhydrAMINE  (BENADRYL ) capsule 50 mg  50 mg Oral TID PRN Onuoha, Josephine C, NP       haloperidol  lactate (HALDOL ) injection 5 mg  5 mg Intramuscular TID PRN Onuoha, Josephine C, NP       And   diphenhydrAMINE  (BENADRYL ) injection 50 mg  50 mg Intramuscular TID PRN Onuoha, Josephine C, NP       And   LORazepam  (ATIVAN ) injection 2 mg  2 mg Intramuscular TID PRN Onuoha, Josephine C, NP       haloperidol  lactate (HALDOL ) injection 10 mg  10 mg Intramuscular TID PRN Onuoha, Josephine C, NP       And   diphenhydrAMINE  (BENADRYL ) injection 50 mg  50 mg Intramuscular TID PRN Onuoha, Josephine C, NP       And   LORazepam  (ATIVAN ) injection 2 mg  2 mg Intramuscular TID PRN Onuoha, Josephine C, NP       escitalopram  (LEXAPRO ) tablet 10 mg  10 mg Oral Daily Remington, Amber E, NP   10 mg at 06/24/23 4098   hydrOXYzine  (ATARAX ) tablet 50 mg  50 mg Oral TID PRN Remington, Amber E, NP       magnesium  hydroxide (MILK OF MAGNESIA) suspension 30 mL  30 mL Oral Daily PRN Onuoha, Josephine C, NP       nicotine  polacrilex (NICORETTE ) gum 2 mg  2 mg  Oral PRN Remington, Amber E, NP   2 mg at 06/24/23 0944   traZODone  (DESYREL ) tablet 50 mg  50 mg Oral QHS PRN Al Alias, NP   50 mg at 06/23/23 2116   PTA Medications: Medications Prior to Admission  Medication Sig Dispense Refill Last Dose/Taking   clonazePAM  (KLONOPIN ) 0.5 MG tablet Take 0.5 mg by mouth daily.   06/22/2023   ALPRAZolam  (XANAX ) 0.25 MG tablet Take 0.125-0.25 mg by mouth daily as needed.      citalopram  (CELEXA ) 20 MG tablet take 1 tablet by mouth once daily 30 tablet 0    gabapentin (NEURONTIN) 300 MG capsule 1-2 po qhs      hyoscyamine  (LEVSIN SL) 0.125 MG SL tablet Place 1 tablet (0.125 mg total) under the tongue every 6 (six) hours as needed. 30 tablet 2     Patient Stressors: Marital or family conflict   Medication change or noncompliance    Patient Strengths: Average or above average intelligence  Capable of independent living   Treatment Modalities: Medication Management, Group therapy, Case management,  1 to 1 session with clinician, Psychoeducation, Recreational therapy.   Physician Treatment Plan for Primary Diagnosis: Major depressive  disorder, recurrent severe without psychotic features (HCC) Long Term Goal(s):     Short Term Goals:    Medication Management: Evaluate patient's response, side effects, and tolerance of medication regimen.  Therapeutic Interventions: 1 to 1 sessions, Unit Group sessions and Medication administration.  Evaluation of Outcomes: Progressing  Physician Treatment Plan for Secondary Diagnosis: Principal Problem:   Major depressive disorder, recurrent severe without psychotic features (HCC)  Long Term Goal(s):     Short Term Goals:       Medication Management: Evaluate patient's response, side effects, and tolerance of medication regimen.  Therapeutic Interventions: 1 to 1 sessions, Unit Group sessions and Medication administration.  Evaluation of Outcomes: Progressing   RN Treatment Plan for Primary Diagnosis:  Major depressive disorder, recurrent severe without psychotic features (HCC) Long Term Goal(s): Knowledge of disease and therapeutic regimen to maintain health will improve  Short Term Goals: Ability to remain free from injury will improve, Ability to verbalize frustration and anger appropriately will improve, Ability to demonstrate self-control, Ability to participate in decision making will improve, Ability to verbalize feelings will improve, Ability to disclose and discuss suicidal ideas, Ability to identify and develop effective coping behaviors will improve, and Compliance with prescribed medications will improve  Medication Management: RN will administer medications as ordered by provider, will assess and evaluate patient's response and provide education to patient for prescribed medication. RN will report any adverse and/or side effects to prescribing provider.  Therapeutic Interventions: 1 on 1 counseling sessions, Psychoeducation, Medication administration, Evaluate responses to treatment, Monitor vital signs and CBGs as ordered, Perform/monitor CIWA, COWS, AIMS and Fall Risk screenings as ordered, Perform wound care treatments as ordered.  Evaluation of Outcomes: Progressing   LCSW Treatment Plan for Primary Diagnosis: Major depressive disorder, recurrent severe without psychotic features (HCC) Long Term Goal(s): Safe transition to appropriate next level of care at discharge, Engage patient in therapeutic group addressing interpersonal concerns.  Short Term Goals: Engage patient in aftercare planning with referrals and resources, Increase social support, Increase ability to appropriately verbalize feelings, Increase emotional regulation, Facilitate acceptance of mental health diagnosis and concerns, and Increase skills for wellness and recovery  Therapeutic Interventions: Assess for all discharge needs, 1 to 1 time with Social worker, Explore available resources and support systems, Assess  for adequacy in community support network, Educate family and significant other(s) on suicide prevention, Complete Psychosocial Assessment, Interpersonal group therapy.  Evaluation of Outcomes: Progressing   Progress in Treatment: Attending groups: Yes. Participating in groups: Yes. Taking medication as prescribed: Yes. Toleration medication: Yes. Family/Significant other contact made: Yes, individual(s) contacted:  mother, Rocco Blankley. Patient understands diagnosis: Yes. Discussing patient identified problems/goals with staff: Yes. Medical problems stabilized or resolved: Yes. Denies suicidal/homicidal ideation: Yes. Issues/concerns per patient self-inventory: No. Other: none.  New problem(s) identified: No, Describe:  none identified.  New Short Term/Long Term Goal(s): medication management for mood stabilization; elimination of SI thoughts; development of comprehensive mental wellness plan.  Patient Goals:  "To learn how to cope with anxiety better. Communicate better."   Discharge Plan or Barriers: CSW will assist pt with development of an appropriate aftercare/discharge plan.   Reason for Continuation of Hospitalization: Anxiety Medication stabilization Suicidal ideation  Estimated Length of Stay: 1-7 days  Last 3 [place] Suicide Severity Risk Score: Flowsheet Row Admission (Current) from 06/22/2023 in [facility] INPATIENT BEHAVIORAL MEDICINE Most recent reading at 06/22/2023  8:24 PM ED from 06/22/2023 in [facility] Emergency Department at [facility] Most recent reading at 06/22/2023  10:36 AM ED from 06/14/2023 in [facility] Most recent reading at 06/14/2023  1:46 PM  C-SSRS RISK CATEGORY Low Risk Low Risk No Risk       Last PHQ 2/9 Scores:     No data to display          Scribe for Treatment Team: Randolm Butte, Buzz Cass 06/24/2023 10:37 AM

## 2023-06-25 MED ORDER — TRAZODONE HCL 50 MG PO TABS: 50.0000 mg | ORAL_TABLET | Freq: Every evening | ORAL | 0 refills | Status: DC | PRN

## 2023-06-25 MED ORDER — HYDROXYZINE HCL 50 MG PO TABS: 50.0000 mg | ORAL_TABLET | Freq: Three times a day (TID) | ORAL | 0 refills | Status: DC | PRN

## 2023-06-25 MED ORDER — ADULT MULTIVITAMIN W/MINERALS CH
1.0000 | ORAL_TABLET | Freq: Every day | ORAL | Status: DC
  Administered 2023-06-25: 1 via ORAL
  Filled 2023-06-25: qty 1

## 2023-06-25 MED ORDER — ESCITALOPRAM OXALATE 10 MG PO TABS: 10.0000 mg | ORAL_TABLET | Freq: Every day | ORAL | 0 refills | Status: DC

## 2023-06-25 MED ORDER — ENSURE ENLIVE PO LIQD: 237.0000 mL | Freq: Two times a day (BID) | ORAL | Status: DC

## 2023-06-25 NOTE — Progress Notes (Signed)
   06/24/23 1953  Psych Admission Type (Psych Patients Only)  Admission Status Voluntary  Psychosocial Assessment  Patient Complaints Anxiety  Eye Contact Fair  Facial Expression Flat  Affect Flat  Speech Logical/coherent  Interaction Assertive  Motor Activity Slow  Appearance/Hygiene In scrubs  Behavior Characteristics Cooperative  Aggressive Behavior  Effect No apparent injury  Thought Process  Coherency WDL  Content WDL  Delusions None reported or observed  Perception WDL  Hallucination None reported or observed  Judgment WDL  Confusion None  Danger to Self  Current suicidal ideation?  (Denies)  Danger to Others  Danger to Others None reported or observed

## 2023-06-25 NOTE — H&P (Addendum)
 Psychiatric Admission Assessment Adult  Patient Identification: Derek Hoffman MRN:  [id number] Date of Evaluation:  06/23/2023 Chief Complaint:  Major depressive disorder, recurrent severe without psychotic features (HCC) [F33.2] Principal Diagnosis: Generalized anxiety disorder Diagnosis:  Principal Problem:   Generalized anxiety disorder Active Problems:   Major depressive disorder, recurrent severe without psychotic features (HCC)  History of Present Illness: Derek Hoffman is a 33 year old Caucasian male with a history of anxiety, depression, and post-concussive syndrome who presented voluntarily due to intrusive suicidal thoughts for the past two days. Patient started trial of Cymbalta approximately six days ago due to worsening anxiety and depression. Since initiation of treatment, he has experienced insomnia, loss of appetite, severe anxiety, and intrusive suicidal thoughts. He has an upcoming appointment with outpatient psychiatry but its still a few weeks out. He has been staying with his parents for the past few days as he was afraid he may harm himself. Of note, patient is an avid psychologist, clinical. Patient reports history of depression and anxiety with panic attacks since age 14 and had been stable on Celexa  20mg  for 15 years. He stopped his medication for a period of 8 months. Following a series of psychosocial stressors,  his PCP restarted Celexa  at 40mg  three weeks ago. Patient feels this exacerbated his anxiety which led him to trial of Cymbalta. Last dose of Cymbalta was 2 days ago. Today, patient presents with significant anxiety. He is a good historian. He reports the intrusive thoughts have stopped and he denies suicidal ideation at this time. He slept well overnight with Trazodone  PRN. He is established with outpatient therapy and awaiting intake medication management appointment.  Associated Signs/Symptoms: Depression Symptoms:  depressed mood, insomnia, psychomotor  agitation, feelings of worthlessness/guilt, difficulty concentrating, recurrent thoughts of death, suicidal thoughts without plan, anxiety, panic attacks, disturbed sleep, (Hypo) Manic Symptoms:   None Anxiety Symptoms:  Excessive Worry, Panic Symptoms, Obsessive Compulsive Symptoms:   intrusive thoughts, Psychotic Symptoms:   None PTSD Symptoms: None Total Time spent with patient: 1 hour  Past Psychiatric History:  History of GAD and panic disorder Denies history of suicide attempts Denies history of violence Psychotropic medications managed by PCP Past trials: Celexa  (effective for years), Xanax , Gabapentin  Is the patient at risk to self? No.  Has the patient been a risk to self in the past 6 months? Yes.    Has the patient been a risk to self within the distant past? Yes.    Is the patient a risk to others? No.  Has the patient been a risk to others in the past 6 months? No.  Has the patient been a risk to others within the distant past? No.   Columbia Scale:  Flowsheet Row Admission (Current) from 06/22/2023 in [facility]PAT[facility]INE Most recent reading at 06/22/2023  8:24 PM ED from 06/22/2023 in [facility] Emergency Department at [facility] Most recent reading at 06/22/2023 10:36 AM ED from 06/14/2023 in [facility] Most recent reading at 06/14/2023  1:46 PM  C-SSRS RISK CATEGORY Low Risk Low Risk No Risk        Prior Inpatient Therapy: No.  Prior Outpatient Therapy: Yes.   If yes, describe: [facility]   Alcohol  Screening: 1. How often do you have a drink containing alcohol ?: Never 2. How many drinks containing alcohol  do you have on a typical day when you are drinking?: 1 or 2 3. How often do you have six or more drinks on one  occasion?: Never AUDIT-C Score: 0 4. How often during the last year have you found that you were not able to stop drinking once you had started?: Never 5. How often during the  last year have you failed to do what was normally expected from you because of drinking?: Never 6. How often during the last year have you needed a first drink in the morning to get yourself going after a heavy drinking session?: Never 7. How often during the last year have you had a feeling of guilt of remorse after drinking?: Never 8. How often during the last year have you been unable to remember what happened the night before because you had been drinking?: Never 9. Have you or someone else been injured as a result of your drinking?: No 10. Has a relative or friend or a doctor or another health worker been concerned about your drinking or suggested you cut down?: No Alcohol  Use Disorder Identification Test Final Score (AUDIT): 0 Alcohol  Brief Interventions/Follow-up: Alcohol  education/Brief advice Substance Abuse History in the last 12 months:  No. 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   Depression    History of MRSA infection 2015   left arm   Lateral meniscus tear 12/2014   left    Past Surgical History:  Procedure Laterality Date   CHONDROPLASTY Left 01/11/2015   Procedure: CHONDROPLASTY;  Surgeon: Toribio JULIANNA Chancy, MD;  Location: [facility];  Service: Orthopedics;  Laterality: Left;   FEMUR HARDWARE REMOVAL Left 10/28/2007   KNEE ARTHROSCOPY Left 10/28/2007   with manipulation   KNEE ARTHROSCOPY WITH EXCISION PLICA Left 01/11/2015   Procedure: KNEE ARTHROSCOPY WITH EXCISION PLICA;  Surgeon: Toribio JULIANNA Chancy, MD;  Location: [facility];  Service: Orthopedics;  Laterality: Left;   KNEE ARTHROSCOPY WITH LATERAL MENISECTOMY Left 01/11/2015   Procedure: LEFT KNEE ARTHROSCOPY  WITH LATERAL MENISCECTOMY, EXCISION OF MEDIAL PLICA, CHONDROPLASTY;  Surgeon: Toribio JULIANNA Chancy, MD;   Location: [facility];  Service: Orthopedics;  Laterality: Left;   NASAL SEPTOPLASTY W/ TURBINOPLASTY Bilateral 10/08/2020   Procedure: REVISION NASAL SEPTOPLASTY WITH TURBINATE REDUCTION AND SEPTUM REVISION;  Surgeon: Karis Clunes, MD;  Location: [facility];  Service: ENT;  Laterality: Bilateral;   ORIF FEMUR FRACTURE Left    SEPTOPLASTY     SHOULDER ARTHROSCOPY WITH ROTATOR CUFF REPAIR Left 06/27/2021   Procedure: Left Shoulder arthroscopy, debridement, subacromial decompression, distal clavicle resection,;  Surgeon: Melita Drivers, MD;  Location: [facility];  Service: Orthopedics;  Laterality: Left;    TONSILLECTOMY AND ADENOIDECTOMY     WISDOM TOOTH EXTRACTION     Family History:  Family History  Problem Relation Age of Onset   Hypertension Father    Migraines Neg Hx    Family Psychiatric  History:  yes-Paternal Grandfather suffered from Mental illness and was hospitalized several times.  Dad-Anxiety.  Maternal aunt Depression, Bipolar disorder Family Hx suicide: Maternal aunt-Attempted suicide x 2 times   Tobacco Screening:  Social History   Tobacco Use  Smoking Status Former   Current packs/day: 0.00   Average packs/day: 0.3 packs/day for 1 year (0.3 ttl pk-yrs)   Types: Cigarettes   Start date: 2011   Quit date: 2012   Years since quitting: 13.0  Smokeless Tobacco Former   Types: Snuff   Quit date:  2021    [facility] Tobacco Counseling     Are you interested in Tobacco Cessation Medications?  Yes, implement Nicotene Replacement Protocol Counseled patient on smoking cessation:  Yes Reason Tobacco Screening Not Completed: No value filed.       Social History:  Social History   Substance and Sexual Activity  Alcohol  Use Not Currently   Comment: occasionally     Social History   Substance and Sexual Activity  Drug Use No    Additional Social History: Marital status: Long term relationship Long term relationship, how long?: 10 years What  types of issues is patient dealing with in the relationship?: a bunch but that's every relationship Are you sexually active?: Yes What is your sexual orientation?: straight Has your sexual activity been affected by drugs, alcohol , medication, or emotional stress?: started taking that medication Does patient have children?: No                         Allergies:   Allergies  Allergen Reactions   Cymbalta [Duloxetine Hcl] Other (See Comments)    Hallucination, suicide ideation.   Lab Results: No results found for this or any previous visit (from the past 48 hours).  Blood Alcohol  level:  Lab Results  Component Value Date   ETH <10 06/22/2023   ETH <10 12/20/2020    Metabolic Disorder Labs:  Lab Results  Component Value Date   HGBA1C 5.2 08/21/2021   No results found for: PROLACTIN Lab Results  Component Value Date   CHOL 170 08/21/2021   TRIG 99 08/21/2021   HDL 34 (L) 08/21/2021   CHOLHDL 5.0 08/21/2021   VLDL 79.1 07/10/2011   LDLCALC 118 (H) 08/21/2021   LDLCALC 103 (H) 07/10/2011    Current Medications: Current Facility-Administered Medications  Medication Dose Route Frequency Provider Last Rate Last Admin   acetaminophen  (TYLENOL ) tablet 650 mg  650 mg Oral Q6H PRN Onuoha, Josephine C, NP       alum & mag hydroxide-simeth (MAALOX/MYLANTA) 200-200-20 MG/5ML suspension 30 mL  30 mL Oral Q4H PRN Onuoha, Josephine C, NP       clonazePAM  (KLONOPIN ) tablet 0.5 mg  0.5 mg Oral Daily PRN Guilherme Schwenke E, NP       haloperidol  (HALDOL ) tablet 5 mg  5 mg Oral TID PRN Onuoha, Josephine C, NP       And   diphenhydrAMINE  (BENADRYL ) capsule 50 mg  50 mg Oral TID PRN Onuoha, Josephine C, NP       haloperidol  lactate (HALDOL ) injection 5 mg  5 mg Intramuscular TID PRN Onuoha, Josephine C, NP       And   diphenhydrAMINE  (BENADRYL ) injection 50 mg  50 mg Intramuscular TID PRN Onuoha, Josephine C, NP       And   LORazepam  (ATIVAN ) injection 2 mg  2 mg  Intramuscular TID PRN Onuoha, Josephine C, NP       haloperidol  lactate (HALDOL ) injection 10 mg  10 mg Intramuscular TID PRN Onuoha, Josephine C, NP       And   diphenhydrAMINE  (BENADRYL ) injection 50 mg  50 mg Intramuscular TID PRN Onuoha, Josephine C, NP       And   LORazepam  (ATIVAN ) injection 2 mg  2 mg Intramuscular TID PRN Onuoha, Josephine C, NP       escitalopram  (LEXAPRO ) tablet 10 mg  10 mg Oral Daily Anayely Constantine E, NP   10 mg at 06/25/23 [phone]  feeding supplement (ENSURE ENLIVE / ENSURE PLUS) liquid 237 mL  237 mL Oral BID BM Cam Charlie Loving, DO       hydrOXYzine  (ATARAX ) tablet 50 mg  50 mg Oral TID PRN Jaire Pinkham E, NP       magnesium  hydroxide (MILK OF MAGNESIA) suspension 30 mL  30 mL Oral Daily PRN Onuoha, Josephine C, NP       multivitamin with minerals tablet 1 tablet  1 tablet Oral Daily Cam Charlie Loving, DO   1 tablet at 06/25/23 [id number]   nicotine  polacrilex (NICORETTE ) gum 2 mg  2 mg Oral PRN Marisal Swarey E, NP   2 mg at 06/24/23 1749   traZODone  (DESYREL ) tablet 50 mg  50 mg Oral QHS PRN Dixon, Rashaun M, NP   50 mg at 06/24/23 2119   PTA Medications: Medications Prior to Admission  Medication Sig Dispense Refill Last Dose/Taking   clonazePAM  (KLONOPIN ) 0.5 MG tablet Take 0.5 mg by mouth daily.   06/22/2023   ALPRAZolam  (XANAX ) 0.25 MG tablet Take 0.125-0.25 mg by mouth daily as needed.      citalopram  (CELEXA ) 20 MG tablet take 1 tablet by mouth once daily 30 tablet 0    gabapentin (NEURONTIN) 300 MG capsule 1-2 po qhs      hyoscyamine  (LEVSIN  SL) 0.125 MG SL tablet Place 1 tablet (0.125 mg total) under the tongue every 6 (six) hours as needed. 30 tablet 2     Musculoskeletal: Strength & Muscle Tone: within normal limits Gait & Station: normal Patient leans: N/A            Psychiatric Specialty Exam:  Presentation  General Appearance:  Appropriate for Environment; Casual  Eye Contact: Good  Speech: Clear and Coherent;  Normal Rate  Speech Volume: Normal  Handedness: Right   Mood and Affect  Mood: Anxious  Affect: Constricted   Thought Process  Thought Processes: Coherent; Linear  Duration of Psychotic Symptoms: N/A Past Diagnosis of Schizophrenia or Psychoactive disorder: No Descriptions of Associations:Intact  Orientation:Full (Time, Place and Person)  Thought Content:Rumination; Logical  Hallucinations:Denies Ideas of Reference:None  Suicidal Thoughts:Denies Homicidal Thoughts:Denies  Sensorium  Memory: Immediate Good; Recent Good; Remote Good  Judgment: Good  Insight: Good   Executive Functions  Concentration: Good  Attention Span: Good  Recall: Good  Fund of Knowledge: Good  Language: Good   Psychomotor Activity  Psychomotor Activity:Normal  Assets  Assets: Communication Skills; Desire for Improvement; Financial Resources/Insurance; Housing; Physical Health; Resilience; Social Support; Talents/Skills; Transportation; Vocational/Educational   Sleep  Sleep:Fair   Physical Exam: Physical Exam Vitals and nursing note reviewed.  Constitutional:      Appearance: Normal appearance.  HENT:     Head: Normocephalic and atraumatic.     Nose: Nose normal.  Musculoskeletal:        General: Normal range of motion.     Cervical back: Normal range of motion.  Skin:    General: Skin is warm and dry.  Neurological:     Mental Status: He is alert and oriented to person, place, and time.  Psychiatric:        Behavior: Behavior normal.        Judgment: Judgment normal.    Review of Systems  Psychiatric/Behavioral:  Positive for depression and suicidal ideas. The patient is nervous/anxious and has insomnia.   All other systems reviewed and are negative.  Blood pressure 101/61, pulse 62, temperature (!) 97.4 F (36.3 C), resp. rate (!) 24, height 5' 11 (  1.803 m), weight 112 kg, SpO2 97%. Body mass index is 34.45 kg/m.  Treatment Plan  Summary:  Principal Problem:   Generalized anxiety disorder Active Problems:   Major depressive disorder, recurrent severe without psychotic features (HCC)  Daily contact with patient to assess and evaluate symptoms and progress in treatment and Medication management  Observation Level/Precautions:  15 minute checks  Laboratory:   reviewed; no new orders  Psychotherapy:  individual and group therapy  Medications:  start Lexapro  10mg  PO daily for GAD and MDD --  The risks/benefits/side-effects/alternatives to this medication were discussed in detail with the patient and time was given for questions. The patient consents to medication trial.  Consultations:  social work for discharge planning  Discharge Concerns:  firearms in the home; safety planning  Estimated LOS: 3-5 days  Other:  Patient will return home with outpatient referrals for mental health follow-up including medication management and psychotherapy   Long Term Goal(s): Improvement in symptoms so as ready for discharge  Short Term Goals: Ability to identify changes in lifestyle to reduce recurrence of condition will improve, Ability to verbalize feelings will improve, Ability to disclose and discuss suicidal ideas, and Ability to identify and develop effective coping behaviors will improve  I certify that inpatient services furnished can reasonably be expected to improve the patient's condition.    Ellakate Gonsalves FORBES OCEAN, NP 1/16/202510:43 AM

## 2023-06-25 NOTE — Plan of Care (Signed)
Problem: Education: Goal: Knowledge of [facility] Education information/materials will improve Outcome: Adequate for Discharge Goal: Emotional status will improve Outcome: Adequate for Discharge Goal: Mental status will improve Outcome: Adequate for Discharge Goal: Verbalization of understanding the information provided will improve Outcome: Adequate for Discharge   Problem: Activity: Goal: Interest or engagement in activities will improve Outcome: Adequate for Discharge Goal: Sleeping patterns will improve Outcome: Adequate for Discharge   Problem: Coping: Goal: Ability to verbalize frustrations and anger appropriately will improve Outcome: Adequate for Discharge Goal: Ability to demonstrate self-control will improve Outcome: Adequate for Discharge   Problem: Health Behavior/Discharge Planning: Goal: Identification of resources available to assist in meeting health care needs will improve Outcome: Adequate for Discharge Goal: Compliance with treatment plan for underlying cause of condition will improve Outcome: Adequate for Discharge   Problem: Physical Regulation: Goal: Ability to maintain clinical measurements within normal limits will improve Outcome: Adequate for Discharge   Problem: Safety: Goal: Periods of time without injury will increase Outcome: Adequate for Discharge   Problem: Education: Goal: Ability to state activities that reduce stress will improve Outcome: Adequate for Discharge   Problem: Coping: Goal: Ability to identify and develop effective coping behavior will improve Outcome: Adequate for Discharge   Problem: Self-Concept: Goal: Ability to identify factors that promote anxiety will improve Outcome: Adequate for Discharge Goal: Level of anxiety will decrease Outcome: Adequate for Discharge Goal: Ability to modify response to factors that promote anxiety will improve Outcome: Adequate for Discharge   Problem: Education: Goal:  Knowledge of [place] [facility] Education information/materials will improve Outcome: Adequate for Discharge Goal: Emotional status will improve Outcome: Adequate for Discharge Goal: Mental status will improve Outcome: Adequate for Discharge Goal: Verbalization of understanding the information provided will improve Outcome: Adequate for Discharge   Problem: Activity: Goal: Interest or engagement in activities will improve Outcome: Adequate for Discharge Goal: Sleeping patterns will improve Outcome: Adequate for Discharge   Problem: Coping: Goal: Ability to verbalize frustrations and anger appropriately will improve Outcome: Adequate for Discharge Goal: Ability to demonstrate self-control will improve Outcome: Adequate for Discharge   Problem: Health Behavior/Discharge Planning: Goal: Identification of resources available to assist in meeting health care needs will improve Outcome: Adequate for Discharge Goal: Compliance with treatment plan for underlying cause of condition will improve Outcome: Adequate for Discharge   Problem: Physical Regulation: Goal: Ability to maintain clinical measurements within normal limits will improve Outcome: Adequate for Discharge   Problem: Safety: Goal: Periods of time without injury will increase Outcome: Adequate for Discharge   Problem: Education: Goal: Utilization of techniques to improve thought processes will improve Outcome: Adequate for Discharge Goal: Knowledge of the prescribed therapeutic regimen will improve Outcome: Adequate for Discharge   Problem: Activity: Goal: Interest or engagement in leisure activities will improve Outcome: Adequate for Discharge Goal: Imbalance in normal sleep/wake cycle will improve Outcome: Adequate for Discharge   Problem: Coping: Goal: Coping ability will improve Outcome: Adequate for Discharge Goal: Will verbalize feelings Outcome: Adequate for Discharge   Problem: Health  Behavior/Discharge Planning: Goal: Ability to make decisions will improve Outcome: Adequate for Discharge Goal: Compliance with therapeutic regimen will improve Outcome: Adequate for Discharge   Problem: Role Relationship: Goal: Will demonstrate positive changes in social behaviors and relationships Outcome: Adequate for Discharge   Problem: Safety: Goal: Ability to disclose and discuss suicidal ideas will improve Outcome: Adequate for Discharge Goal: Ability to identify and utilize support systems that promote safety will improve Outcome: Adequate  for Discharge   Problem: Self-Concept: Goal: Will verbalize positive feelings about self Outcome: Adequate for Discharge Goal: Level of anxiety will decrease Outcome: Adequate for Discharge

## 2023-06-25 NOTE — Discharge Summary (Signed)
Physician Discharge Summary Note  Patient:  Derek Hoffman is an 33 y.o., male MRN:  [id number] DOB:  January 14, 1991 Patient phone:  [phone] (home)  Patient address:   [address] [name] [address],  Total Time spent with patient: 45 minutes  Date of Admission:  06/22/2023 Date of Discharge: 06/25/2023  Reason for Admission:  Suicidal ideation  Principal Problem: Generalized anxiety disorder Discharge Diagnoses: Principal Problem:   Generalized anxiety disorder Active Problems:   Major depressive disorder, recurrent severe without psychotic features [facility])   Past Psychiatric History: see HPI  Past Medical History:  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   Depression    History of MRSA infection 2015   left arm   Lateral meniscus tear 12/2014   left    Past Surgical History:  Procedure Laterality Date   CHONDROPLASTY Left 01/11/2015   Procedure: CHONDROPLASTY;  Surgeon: [name], MD;  Location: [facility];  Service: Orthopedics;  Laterality: Left;   FEMUR HARDWARE REMOVAL Left 10/28/2007   KNEE ARTHROSCOPY Left 10/28/2007   with manipulation   KNEE ARTHROSCOPY WITH EXCISION PLICA Left 01/11/2015   Procedure: KNEE ARTHROSCOPY WITH EXCISION PLICA;  Surgeon: [name], MD;  Location: [facility];  Service: Orthopedics;  Laterality: Left;   KNEE ARTHROSCOPY WITH LATERAL MENISECTOMY Left 01/11/2015   Procedure: LEFT KNEE ARTHROSCOPY  WITH LATERAL MENISCECTOMY, EXCISION OF MEDIAL PLICA, CHONDROPLASTY;  Surgeon: [name], MD;  Location: [facility];  Service: Orthopedics;  Laterality: Left;   NASAL SEPTOPLASTY W/ TURBINOPLASTY Bilateral 10/08/2020   Procedure: REVISION NASAL SEPTOPLASTY WITH TURBINATE REDUCTION AND SEPTUM REVISION;  Surgeon: Newman Pies, MD;  Location: [facility];  Service: ENT;  Laterality: Bilateral;   ORIF FEMUR FRACTURE Left     SEPTOPLASTY     SHOULDER ARTHROSCOPY WITH ROTATOR CUFF REPAIR Left 06/27/2021   Procedure: Left Shoulder arthroscopy, debridement, subacromial decompression, distal clavicle resection,;  Surgeon: Francena Hanly, MD;  Location: [facility];  Service: Orthopedics;  Laterality: Left;    TONSILLECTOMY AND ADENOIDECTOMY     WISDOM TOOTH EXTRACTION     Family History:  Family History  Problem Relation Age of Onset   Hypertension Father    Migraines Neg Hx    Family Psychiatric  History: see HPI Social History:  Social History   Substance and Sexual Activity  Alcohol Use Not Currently   Comment: occasionally     Social History   Substance and Sexual Activity  Drug Use No    Social History   Socioeconomic History   Marital status: Single    Spouse name: Not on file   Number of children: 0   Years of education: Not on file   Highest education level: Not on file  Occupational History   Occupation: Transport planner  Tobacco Use   Smoking status: Former    Current packs/day: 0.00    Average packs/day: 0.3 packs/day for 1 year (0.3 ttl pk-yrs)    Types: Cigarettes    Start date: 2011    Quit date: 2012    Years since quitting: 13.0   Smokeless tobacco: Former    Types: Snuff    Quit date: 2021  Vaping Use   Vaping status: Every Day   Substances: Nicotine  Substance and Sexual Activity   Alcohol use: Not Currently    Comment: occasionally   Drug use: No  Sexual activity: Not on file  Other Topics Concern   Not on file  Social History Narrative   ** Merged History Encounter ** Lives w/ MFRegular exercise- noGoes to college and works.  Working [employer].  Caffeine- energy drink in AM.  Education: HS diploma with [employer] college.    Social Drivers of Corporate investment banker Strain: Not on file  Food Insecurity: No Food Insecurity (06/22/2023)   Hunger Vital Sign    Worried About Running Out of Food in the Last Year: Never true    Ran Out of Food in the Last  Year: Never true  Transportation Needs: No Transportation Needs (06/22/2023)   PRAPARE - Administrator, Civil Service (Medical): No    Lack of Transportation (Non-Medical): No  Physical Activity: Not on file  Stress: Not on file  Social Connections: Unknown (06/22/2023)   Social Connection and Isolation Panel [NHANES]    Frequency of Communication with Friends and Family: More than three times a week    Frequency of Social Gatherings with Friends and Family: Three times a week    Attends Religious Services: Patient unable to answer    Active Member of Clubs or Organizations: Patient unable to answer    Attends Club or Organization Meetings: Patient declined    Marital Status: Never married    Hospital Course:  [name] is a 33 year old Caucasian male with a history of anxiety, depression, and post-concussive syndrome who presented voluntarily due to intrusive suicidal thoughts for the past two days in the context of recent medication changes and increased stressors. During the hospitalization, patient received group therapy, recreational therapy, psychoeducation, and medication management. Pharmacologic treatment included trial of escitalopram 10mg  daily for MDD and generalized anxiety disorder, hydroxyzine 50mg  as needed for anxiety, and trazodone 50mg  as needed for sleep. The patient showed positive response to treatment and demonstrated progress in coping skills. By discharge, the patient exhibited stable mood, improved thought processes, and resolution of acute symptoms. He was discharged home with family and was educated on medication adherence, crisis management, and participated in establishing a safety plan. Firearms have been removed from his home and secured by family.  Physical Findings: AIMS:  , ,  ,  ,    CIWA:    COWS:     Musculoskeletal: Strength & Muscle Tone: within normal limits Gait & Station: normal Patient leans: N/A   Psychiatric Specialty  Exam:  Presentation  General Appearance:  Appropriate for Environment  Eye Contact: Good  Speech: Clear and Coherent; Normal Rate  Speech Volume: Normal  Handedness: Right   Mood and Affect  Mood: Euthymic  Affect: Appropriate; Congruent   Thought Process  Thought Processes: Coherent; Goal Directed; Linear  Descriptions of Associations:Intact  Orientation:Full (Time, Place and Person)  Thought Content:Logical; Abstract Reasoning  History of Schizophrenia/Schizoaffective disorder:No data recorded Duration of Psychotic Symptoms:No data recorded Hallucinations:Hallucinations: None  Ideas of Reference:None  Suicidal Thoughts:Suicidal Thoughts: No  Homicidal Thoughts:Homicidal Thoughts: No   Sensorium  Memory: Immediate Good; Remote Good; Recent Good  Judgment: Good  Insight: Good   Executive Functions  Concentration: Good  Attention Span: Good  Recall: Good  Fund of Knowledge: Good  Language: Good   Psychomotor Activity  Psychomotor Activity: Psychomotor Activity: Normal   Assets  Assets: Communication Skills; Desire for Improvement; Financial Resources/Insurance; Housing; Resilience; Social Support; Talents/Skills; Vocational/Educational   Sleep  Sleep: Sleep: Good    Physical Exam: Physical Exam Vitals and nursing note reviewed.  Constitutional:      Appearance: Normal appearance.  HENT:     Head: Normocephalic and atraumatic.     Nose: Nose normal.  Eyes:     Extraocular Movements: Extraocular movements intact.  Pulmonary:     Effort: Pulmonary effort is normal.  Musculoskeletal:        General: Normal range of motion.     Cervical back: Normal range of motion.  Skin:    General: Skin is dry.  Neurological:     Mental Status: He is alert and oriented to person, place, and time.  Psychiatric:        Mood and Affect: Mood normal.        Behavior: Behavior normal.        Thought Content: Thought content  normal.        Judgment: Judgment normal.    Review of Systems  All other systems reviewed and are negative.  Blood pressure 101/61, pulse 62, temperature (!) 97.4 F (36.3 C), resp. rate (!) 24, height 5\' 11"  (1.803 m), weight 112 kg, SpO2 97%. Body mass index is 34.45 kg/m.   Social History   Tobacco Use  Smoking Status Former   Current packs/day: 0.00   Average packs/day: 0.3 packs/day for 1 year (0.3 ttl pk-yrs)   Types: Cigarettes   Start date: 2011   Quit date: 2012   Years since quitting: 13.0  Smokeless Tobacco Former   Types: Snuff   Quit date: 2021   Tobacco Cessation:  N/A, patient does not currently use tobacco products   Blood Alcohol level:  Lab Results  Component Value Date   ETH <10 06/22/2023   ETH <10 12/20/2020    Metabolic Disorder Labs:  Lab Results  Component Value Date   HGBA1C 5.2 08/21/2021   No results found for: "PROLACTIN" Lab Results  Component Value Date   CHOL 170 08/21/2021   TRIG 99 08/21/2021   HDL 34 (L) 08/21/2021   CHOLHDL 5.0 08/21/2021   VLDL 52.8 07/10/2011   LDLCALC 118 (H) 08/21/2021   LDLCALC 103 (H) 07/10/2011    See Psychiatric Specialty Exam and Suicide Risk Assessment completed by Attending Physician prior to discharge.  Discharge destination:  Home  Is patient on multiple antipsychotic therapies at discharge:  No   Has Patient had three or more failed trials of antipsychotic monotherapy by history:  No  Recommended Plan for Multiple Antipsychotic Therapies: NA  Discharge Instructions     Diet general   Complete by: As directed    Increase activity slowly   Complete by: As directed       Allergies as of 06/25/2023       Reactions   Cymbalta [duloxetine Hcl] Other (See Comments)   Hallucination, suicide ideation.        Medication List     STOP taking these medications    ALPRAZolam 0.25 MG tablet Commonly known as: XANAX   citalopram 20 MG tablet Commonly known as: CELEXA    clonazePAM 0.5 MG tablet Commonly known as: KLONOPIN   gabapentin 300 MG capsule Commonly known as: NEURONTIN   hyoscyamine 0.125 MG SL tablet Commonly known as: LEVSIN SL       TAKE these medications      Indication  escitalopram 10 MG tablet Commonly known as: LEXAPRO Take 1 tablet (10 mg total) by mouth daily. Start taking on: June 26, 2023  Indication: Generalized Anxiety Disorder, Major Depressive Disorder   hydrOXYzine 50 MG tablet Commonly known  as: ATARAX Take 1 tablet (50 mg total) by mouth 3 (three) times daily as needed for anxiety.  Indication: Feeling Anxious   traZODone 50 MG tablet Commonly known as: DESYREL Take 1 tablet (50 mg total) by mouth at bedtime as needed for sleep.  Indication: Trouble Sleeping        Follow-up Information     [facility] Follow up.   Specialty: Behavioral Health Why: Appointment is scheduled for 07/16/2023 at [facility] information: [address] [place] [address] [phone]        [facility] Follow up.   Why: Appointment is scheduled for 07/15/2023 at 8AM.  Detailed email will be sent to your inbox.  Please complete the paperwork 48 hours prior to appointment.  MUST cancel with 24 hour advance notice. Contact information: [address] [place] [address] [phone]                 Follow-up recommendations:  -It is recommended to the patient to continue psychiatric medications as prescribed, after discharge from the hospital.   - It is recommended to the patient to follow up with your outpatient psychiatric provider and PCP. - It was discussed with the patient, the impact of alcohol, drugs, tobacco have been there overall psychiatric and medical wellbeing, and total abstinence from substance use was recommended the patient. - Prescriptions provided or sent directly to preferred pharmacy at discharge. Patient  agreeable to plan. Given opportunity to ask questions. Appears to feel comfortable with discharge.   - In the event of worsening symptoms, the patient is instructed to call the crisis hotline, 911 and or go to the nearest ED for appropriate evaluation and treatment of symptoms. To follow-up with primary care provider for other medical issues, concerns and or health care needs - Patient was discharged home with a plan to follow up as noted  Comments:  The patient is being discharged in stable condition. At the time of discharge, the patient was able to verbalize understanding of the treatment plan and follow-up recommendations.   Signed: Lovette Cliche, NP 06/25/2023, 11:30 AM
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
## 2023-06-25 NOTE — Progress Notes (Signed)
  [facility] Adult Case Management Discharge Plan :  Will you be returning to the same living situation after discharge:  No. At discharge, do you have transportation home?: Yes,  pt support system to provide transportation.  Do you have the ability to pay for your medications: Yes,  [employer] QHP.  Release of information consent forms completed and in the chart;  Patient's signature needed at discharge.  Patient to Follow up at:  Follow-up Information     [facility] Follow up.   Specialty: Behavioral Health Why: Appointment is scheduled for 07/16/2023 at [facility] information: [address] [place] [address] [phone]        [facility] Follow up.   Why: Appointment is scheduled for 07/15/2023 at 8AM.  Detailed email will be sent to your inbox.  Please complete the paperwork 48 hours prior to appointment.  MUST cancel with 24 hour advance notice. Contact information: [address] [place] [address] [phone]                 Next level of care provider has access to [facility] Link:no  Safety Planning and Suicide Prevention discussed: Yes,  SPE completed with mother, Karsyn Stiltz.     Has patient been referred to the Quitline?: Yes, faxed/e-referral on 06/25/23.  Patient has been referred for addiction treatment: No known substance use disorder.  Glenis Smoker, LCSW 06/25/2023, 9:48 AM

## 2023-06-25 NOTE — Group Note (Signed)
Recreation Therapy Group Note   Group Topic:Coping Skills  Group Date: 06/25/2023 Start Time: 1000 End Time: 1045 Facilitators: Rosina Lowenstein, LRT, CTRS Location:  Craft Room  Group Description: Mind Map.  Patient was provided a blank template of a diagram with 32 blank boxes in a tiered system, branching from the center (similar to a bubble chart). LRT directed patients to label the middle of the diagram "Coping Skills". LRT and patients then came up with 8 different coping skills as examples. Pt were directed to record their coping skills in the 2nd tier boxes closest to the center.  Patients would then share their coping skills with the group as LRT wrote them out. LRT gave a handout of 99 different coping skills at the end of group.   Goal Area(s) Addressed: Patients will be able to define "coping skills". Patient will identify new coping skills.  Patient will increase communication.   Affect/Mood: N/A   Participation Level: Did not attend    Clinical Observations/Individualized Feedback: Patient did not attend group.   Plan: Continue to engage patient in RT group sessions 2-3x/week.   Rosina Lowenstein, LRT, CTRS 06/25/2023 12:18 PM

## 2023-06-25 NOTE — Progress Notes (Signed)
Suicide Risk Assessment  Discharge Assessment    [facility] Discharge Suicide Risk Assessment   Principal Problem: Generalized anxiety disorder Discharge Diagnoses: Principal Problem:   Generalized anxiety disorder Active Problems:   Major depressive disorder, recurrent severe without psychotic features (HCC)   Total Time spent with patient: 45 minutes  Musculoskeletal: Strength & Muscle Tone: within normal limits Gait & Station: normal Patient leans: N/A  Psychiatric Specialty Exam  Presentation  General Appearance:  Appropriate for Environment  Eye Contact: Good  Speech: Clear and Coherent; Normal Rate  Speech Volume: Normal  Handedness: Right   Mood and Affect  Mood: Euthymic  Duration of Depression Symptoms: No data recorded Affect: Appropriate; Congruent   Thought Process  Thought Processes: Coherent; Goal Directed; Linear  Descriptions of Associations:Intact  Orientation:Full (Time, Place and Person)  Thought Content:Logical; Abstract Reasoning  History of Schizophrenia/Schizoaffective disorder:No data recorded Duration of Psychotic Symptoms:No data recorded Hallucinations:Hallucinations: None  Ideas of Reference:None  Suicidal Thoughts:Suicidal Thoughts: No  Homicidal Thoughts:Homicidal Thoughts: No   Sensorium  Memory: Immediate Good; Remote Good; Recent Good  Judgment: Good  Insight: Good   Executive Functions  Concentration: Good  Attention Span: Good  Recall: Good  Fund of Knowledge: Good  Language: Good   Psychomotor Activity  Psychomotor Activity: Psychomotor Activity: Normal   Assets  Assets: Communication Skills; Desire for Improvement; Financial Resources/Insurance; Housing; Resilience; Social Support; Talents/Skills; Vocational/Educational   Sleep  Sleep: Sleep: Good   Physical Exam: Physical Exam ROS Blood pressure 101/61, pulse 62, temperature (!) 97.4 F (36.3 C), resp. rate (!) 24, height  5\' 11"  (1.803 m), weight 112 kg, SpO2 97%. Body mass index is 34.45 kg/m.  Mental Status Per Nursing Assessment::   On Admission:  NA  Demographic Factors:  Male, Adolescent or young adult, and Living alone  Loss Factors: NA  Historical Factors: NA  Risk Reduction Factors:   Sense of responsibility to family, Employed, Positive social support, Positive therapeutic relationship, Positive coping skills or problem solving skills, and firearms have been secured by family.  Continued Clinical Symptoms:  Previous Psychiatric Diagnoses and Treatments Medical Diagnoses and Treatments/Surgeries  Cognitive Features That Contribute To Risk:  None    Suicide Risk:  Minimal: No identifiable suicidal ideation.  Patients presenting with minimal risk factors; may be classified as minimal risk based on protective factors   Follow-up Information     [facility] Follow up.   Specialty: Behavioral Health Why: Appointment is scheduled for 07/16/2023 at [facility] information: [address] [place] [address] [phone]        [facility] Follow up.   Why: Appointment is scheduled for 07/15/2023 at 8AM.  Detailed email will be sent to your inbox.  Please complete the paperwork 48 hours prior to appointment.  MUST cancel with 24 hour advance notice. Contact information: [address] [place] [address] [phone]                 Plan Of Care/Follow-up recommendations:  Activity:  as tolerated Diet:  regular Other:  take medication as prescribed; follow-up with outpatient therapy and medication management appointments as scheduled ; provide crisis resources  Denice Cardon E Arturo Sofranko, NP 06/25/2023, 11:23 AM

## 2023-06-28 NOTE — Progress Notes (Signed)
 [facility] MD Progress Note   Derek Hoffman  MRN:  [id number]  Derek Hoffman is a 33 year old Caucasian male with a history of anxiety, depression, and post-concussive syndrome who presented voluntarily due to intrusive suicidal thoughts for the past two days. Patient started trial of Cymbalta approximately six days ago due to worsening anxiety and depression. Since initiation of treatment, he has experienced insomnia, loss of appetite, severe anxiety, and intrusive suicidal thoughts. He has an upcoming appointment with outpatient psychiatry but its still a few weeks out. He has been staying with his parents for the past few days as he was afraid he may harm himself. Of note, patient is an avid Psychologist, clinical.  Subjective:  Chart reviewed, case discussed with multidisciplinary team, and patient seen during rounds. He presents calm, cooperative, controlled. Reports he slept well overnight and has a good appetite. Patient has been visible in the milieu, attending groups, and appropriately interacting with peers and staff. He denies thoughts of harming self and others. Reports feeling significantly better. 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 Patient is advocating for discharge as soon as possible. He is motivated for outpatient treatment. Safety planning has been initiated and family is removing firearms from his home.  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   Depression    History of MRSA infection 2015   left arm   Lateral meniscus tear 12/2014   left     Past Surgical History:  Procedure Laterality Date   CHONDROPLASTY Left 01/11/2015   Procedure: CHONDROPLASTY;  Surgeon: [name], MD;  Location: [facility];  Service: Orthopedics;  Laterality: Left;   FEMUR HARDWARE REMOVAL Left 10/28/2007   KNEE ARTHROSCOPY Left 10/28/2007   with manipulation   KNEE ARTHROSCOPY WITH EXCISION PLICA Left 01/11/2015   Procedure: KNEE ARTHROSCOPY WITH EXCISION PLICA;  Surgeon: [name], MD;  Location: [facility];  Service: Orthopedics;  Laterality: Left;   KNEE ARTHROSCOPY WITH LATERAL MENISECTOMY Left 01/11/2015   Procedure: LEFT KNEE ARTHROSCOPY  WITH LATERAL MENISCECTOMY, EXCISION OF MEDIAL PLICA, CHONDROPLASTY;  Surgeon: [name], MD;  Location: [facility];  Service: Orthopedics;  Laterality: Left;   NASAL SEPTOPLASTY W/ TURBINOPLASTY Bilateral 10/08/2020   Procedure: REVISION NASAL SEPTOPLASTY WITH TURBINATE REDUCTION AND SEPTUM REVISION;  Surgeon: Newman Pies, MD;  Location: [facility];  Service: ENT;  Laterality: Bilateral;   ORIF FEMUR FRACTURE Left    SEPTOPLASTY     SHOULDER ARTHROSCOPY WITH ROTATOR CUFF REPAIR Left 06/27/2021   Procedure: Left Shoulder arthroscopy, debridement, subacromial decompression, distal clavicle resection,;  Surgeon: Francena Hanly, MD;  Location: [facility];  Service: Orthopedics;  Laterality: Left;    TONSILLECTOMY AND ADENOIDECTOMY     WISDOM TOOTH EXTRACTION     Family History:  Family History  Problem Relation Age of Onset   Hypertension Father    Migraines Neg Hx    Family Psychiatric  History: see h&p Social History:  Social History   Substance and Sexual Activity  Alcohol Use Not Currently   Comment: occasionally  Social History   Substance and Sexual Activity  Drug Use No    Social History   Socioeconomic History   Marital status: Single    Spouse name: Not on file   Number of children: 0   Years of education: Not on file    Highest education level: Not on file  Occupational History   Occupation: Transport planner  Tobacco Use   Smoking status: Former    Current packs/day: 0.00    Average packs/day: 0.3 packs/day for 1 year (0.3 ttl pk-yrs)    Types: Cigarettes    Start date: 2011    Quit date: 2012    Years since quitting: 13.0   Smokeless tobacco: Former    Types: Snuff    Quit date: 2021  Vaping Use   Vaping status: Every Day   Substances: Nicotine  Substance and Sexual Activity   Alcohol use: Not Currently    Comment: occasionally   Drug use: No   Sexual activity: Not on file  Other Topics Concern   Not on file  Social History Narrative   ** Merged History Encounter ** Lives w/ MFRegular exercise- noGoes to college and works.  Working [employer].  Caffeine- energy drink in AM.  Education: HS diploma with [employer] college.    Social Drivers of Corporate investment banker Strain: Not on file  Food Insecurity: No Food Insecurity (06/22/2023)   Hunger Vital Sign    Worried About Running Out of Food in the Last Year: Never true    Ran Out of Food in the Last Year: Never true  Transportation Needs: No Transportation Needs (06/22/2023)   PRAPARE - Administrator, Civil Service (Medical): No    Lack of Transportation (Non-Medical): No  Physical Activity: Not on file  Stress: Not on file  Social Connections: Unknown (06/22/2023)   Social Connection and Isolation Panel [NHANES]    Frequency of Communication with Friends and Family: More than three times a week    Frequency of Social Gatherings with Friends and Family: Three times a week    Attends Religious Services: Patient unable to answer    Active Member of Clubs or Organizations: Patient unable to answer    Attends Club or Organization Meetings: Patient declined    Marital Status: Never married   Additional Social History:                         Sleep: Good  Appetite:  Good  Current Medications: No current  facility-administered medications for this encounter.   Current Outpatient Medications  Medication Sig Dispense Refill   escitalopram (LEXAPRO) 10 MG tablet Take 1 tablet (10 mg total) by mouth daily. 30 tablet 0   hydrOXYzine (ATARAX) 50 MG tablet Take 1 tablet (50 mg total) by mouth 3 (three) times daily as needed for anxiety. 30 tablet 0   traZODone (DESYREL) 50 MG tablet Take 1 tablet (50 mg total) by mouth at bedtime as needed for sleep. 30 tablet 0    Lab Results: No results found for this or any previous visit (from the past 48 hours).  Blood Alcohol level:  Lab Results  Component Value Date   [facility] <10 06/22/2023   ETH <10 12/20/2020    Metabolic Disorder Labs: Lab Results  Component Value Date   HGBA1C 5.2 08/21/2021   No results found for: "PROLACTIN" Lab Results  Component Value Date   CHOL 170 08/21/2021   TRIG  99 08/21/2021   HDL 34 (L) 08/21/2021   CHOLHDL 5.0 08/21/2021   VLDL 47.8 07/10/2011   LDLCALC 118 (H) 08/21/2021   LDLCALC 103 (H) 07/10/2011    Physical Findings: AIMS:  , ,  ,  ,    CIWA:    COWS:     Musculoskeletal: Strength & Muscle Tone: within normal limits Gait & Station: normal Patient leans: N/A  Psychiatric Specialty Exam:  Presentation  General Appearance:  Appropriate for Environment  Eye Contact: Good  Speech: Clear and Coherent; Normal Rate  Speech Volume: Normal  Handedness: Right   Mood and Affect  Mood: Euthymic  Affect: Appropriate; Congruent   Thought Process  Thought Processes: Coherent; Goal Directed; Linear  Descriptions of Associations:Intact  Orientation:Full (Time, Place and Person)  Thought Content:Logical; Abstract Reasoning  History of Schizophrenia/Schizoaffective disorder:No data recorded Duration of Psychotic Symptoms:No data recorded Hallucinations:No data recorded Ideas of Reference:None  Suicidal Thoughts:No data recorded Homicidal Thoughts:No data recorded  Sensorium   Memory: Immediate Good; Remote Good; Recent Good  Judgment: Good  Insight: Good   Executive Functions  Concentration: Good  Attention Span: Good  Recall: Good  Fund of Knowledge: Good  Language: Good   Psychomotor Activity  Psychomotor Activity:No data recorded  Assets  Assets: Communication Skills; Desire for Improvement; Financial Resources/Insurance; Housing; Resilience; Social Support; Talents/Skills; Vocational/Educational   Sleep  Sleep:Good   Physical Exam: Physical Exam Vitals and nursing note reviewed.  HENT:     Head: Normocephalic and atraumatic.  Eyes:     Extraocular Movements: Extraocular movements intact.  Pulmonary:     Effort: Pulmonary effort is normal.  Musculoskeletal:        General: Normal range of motion.     Cervical back: Normal range of motion.  Skin:    General: Skin is warm and dry.  Neurological:     Mental Status: He is alert and oriented to person, place, and time.  Psychiatric:        Behavior: Behavior normal.        Thought Content: Thought content normal.        Judgment: Judgment normal.    ROS Blood pressure 101/61, pulse 62, temperature (!) 97.4 F (36.3 C), resp. rate (!) 24, height 5\' 11"  (1.803 m), weight 112 kg, SpO2 97%. Body mass index is 34.45 kg/m.   Treatment Plan Summary:  Daily contact with patient to assess and evaluate symptoms and progress in treatment and Medication management   Observation Level/Precautions:  15 minute checks  Laboratory:   reviewed; no new orders  Psychotherapy:  individual and group therapy  Medications:  start Lexapro 10mg  PO daily for GAD and MDD Continue trazodone 50mg  PO at bedtime for insomnia Hydroxyzine 50mg  PO TID PRN anxiety --  The risks/benefits/side-effects/alternatives to this medication were discussed in detail with the patient and time was given for questions. The patient consents to medication trial.  Consultations:  social work for discharge  planning  Discharge Concerns:  firearms in the home; safety planning  Estimated LOS: 3-5 days  Other:  Patient will return home with outpatient referrals for mental health follow-up including medication management and psychotherapy    Meshulem Onorato Robyne Peers, NP

## 2023-06-29 DIAGNOSIS — M47812 Spondylosis without myelopathy or radiculopathy, cervical region: Secondary | ICD-10-CM | POA: Diagnosis not present

## 2023-07-02 DIAGNOSIS — F332 Major depressive disorder, recurrent severe without psychotic features: Secondary | ICD-10-CM | POA: Diagnosis not present

## 2023-07-02 DIAGNOSIS — F411 Generalized anxiety disorder: Secondary | ICD-10-CM | POA: Diagnosis not present

## 2023-07-02 DIAGNOSIS — F410 Panic disorder [episodic paroxysmal anxiety] without agoraphobia: Secondary | ICD-10-CM | POA: Diagnosis not present

## 2023-07-05 ENCOUNTER — Emergency Department (HOSPITAL_BASED_OUTPATIENT_CLINIC_OR_DEPARTMENT_OTHER): Payer: 59

## 2023-07-05 ENCOUNTER — Emergency Department (HOSPITAL_BASED_OUTPATIENT_CLINIC_OR_DEPARTMENT_OTHER)
Admission: EM | Admit: 2023-07-05 | Discharge: 2023-07-05 | Disposition: A | Discharge status: Home / Self Care | Payer: 59 | Attending: Emergency Medicine | Admitting: Emergency Medicine

## 2023-07-05 ENCOUNTER — Encounter (HOSPITAL_BASED_OUTPATIENT_CLINIC_OR_DEPARTMENT_OTHER): Payer: Self-pay | Admitting: Emergency Medicine

## 2023-07-05 ENCOUNTER — Other Ambulatory Visit: Payer: Self-pay

## 2023-07-05 DIAGNOSIS — R112 Nausea with vomiting, unspecified: Secondary | ICD-10-CM | POA: Diagnosis not present

## 2023-07-05 DIAGNOSIS — R1033 Periumbilical pain: Secondary | ICD-10-CM | POA: Diagnosis not present

## 2023-07-05 DIAGNOSIS — R1032 Left lower quadrant pain: Secondary | ICD-10-CM | POA: Diagnosis not present

## 2023-07-05 LAB — COMPREHENSIVE METABOLIC PANEL
ALT: 27 U/L (ref 0–44)
AST: 14 U/L — ABNORMAL LOW (ref 15–41)
Albumin: 4.8 g/dL (ref 3.5–5.0)
Alkaline Phosphatase: 61 U/L (ref 38–126)
Anion gap: 7 (ref 5–15)
BUN: 8 mg/dL (ref 6–20)
CO2: 30 mmol/L (ref 22–32)
Calcium: 10.3 mg/dL (ref 8.9–10.3)
Chloride: 101 mmol/L (ref 98–111)
Creatinine, Ser: 1.07 mg/dL (ref 0.61–1.24)
GFR, Estimated: >60 mL/min (ref >60)
Glucose, Bld: 97 mg/dL (ref 70–99)
Potassium: 4.5 mmol/L (ref 3.5–5.1)
Sodium: 138 mmol/L (ref 135–145)
Total Bilirubin: 0.6 mg/dL (ref 0.0–1.2)
Total Protein: 7.3 g/dL (ref 6.5–8.1)

## 2023-07-05 LAB — CBC
HCT: 51.1 % (ref 39.0–52.0)
Hemoglobin: 17.5 g/dL — ABNORMAL HIGH (ref 13.0–17.0)
MCH: 29.4 pg (ref 26.0–34.0)
MCHC: 34.2 g/dL (ref 30.0–36.0)
MCV: 85.7 fL (ref 80.0–100.0)
Platelets: 206 10*3/uL (ref 150–400)
RBC: 5.96 MIL/uL — ABNORMAL HIGH (ref 4.22–5.81)
RDW: 12.5 % (ref 11.5–15.5)
WBC: 6.6 10*3/uL (ref 4.0–10.5)
nRBC: 0 % (ref 0.0–0.2)

## 2023-07-05 LAB — URINALYSIS, ROUTINE W REFLEX MICROSCOPIC
Bilirubin Urine: NEGATIVE
Glucose, UA: NEGATIVE mg/dL
Hgb urine dipstick: NEGATIVE
Ketones, ur: NEGATIVE mg/dL
Leukocytes,Ua: NEGATIVE
Nitrite: NEGATIVE
Protein, ur: NEGATIVE mg/dL
Specific Gravity, Urine: <1.005 — ABNORMAL LOW (ref 1.005–1.030)
pH: 6.5 (ref 5.0–8.0)

## 2023-07-05 LAB — LIPASE, BLOOD: Lipase: 19 U/L (ref 11–51)

## 2023-07-05 MED ORDER — DICYCLOMINE HCL 20 MG PO TABS: 20.0000 mg | ORAL_TABLET | Freq: Two times a day (BID) | ORAL | 0 refills | Status: DC

## 2023-07-05 MED ORDER — MORPHINE SULFATE (PF) 4 MG/ML IV SOLN
4.0000 mg | Freq: Once | INTRAVENOUS | Status: AC
Start: 2023-07-05 — End: 2023-07-05
  Administered 2023-07-05: 4 mg via INTRAVENOUS
  Filled 2023-07-05: qty 1

## 2023-07-05 MED ORDER — IOHEXOL 300 MG/ML  SOLN
100.0000 mL | Freq: Once | INTRAMUSCULAR | Status: AC | PRN
  Administered 2023-07-05: 100 mL via INTRAVENOUS

## 2023-07-05 MED ORDER — ONDANSETRON 4 MG PO TBDP
4.0000 mg | ORAL_TABLET | Freq: Once | ORAL | Status: AC | PRN
Start: 2023-07-05 — End: 2023-07-05
  Administered 2023-07-05: 4 mg via ORAL
  Filled 2023-07-05: qty 1

## 2023-07-05 MED ORDER — ONDANSETRON 4 MG PO TBDP: 4.0000 mg | ORAL_TABLET | Freq: Three times a day (TID) | ORAL | 0 refills | Status: DC | PRN

## 2023-07-05 NOTE — ED Triage Notes (Signed)
Pt reports nausea for past 4-5 days, along with sharp LLQ pain that began 2 days ago and has been worsening since.  Eating and smells makes nausea worse.  LBM yesterday, normal per pt.  Pt reports small amount of vomit yesterday but denies blood in vomit today.  Pt began taking prozac, abilify and trazadone Rx's in the past week.

## 2023-07-05 NOTE — ED Notes (Signed)
Patient denies pain and is resting comfortably.

## 2023-07-05 NOTE — Discharge Instructions (Signed)
You have been seen today for your complaint of abdominal pain. Your lab work was reassuring. Your imaging was reassuring. Your discharge medications include Zofran.  This is a nausea medicine.  Take this as needed. Bentyl.  This is a medicine used to help with abdominal pain.  Take this only when you need to.  Continue taking Tylenol and ibuprofen at home for pain. Follow up with: Your psychiatrist Please seek immediate medical care if you develop any of the following symptoms: You cannot stop vomiting. Your pain is only in one part of the abdomen. Pain on the right side could be caused by appendicitis. You have bloody or black poop (stool), or poop that looks like tar. You have trouble breathing. You have chest pain. At this time there does not appear to be the presence of an emergent medical condition, however there is always the potential for conditions to change. Please read and follow the below instructions.  Do not take your medicine if  develop an itchy rash, swelling in your mouth or lips, or difficulty breathing; call 911 and seek immediate emergency medical attention if this occurs.  You may review your lab tests and imaging results in their entirety on your MyChart account.  Please discuss all results of fully with your primary care provider and other specialist at your follow-up visit.  Note: Portions of this text may have been transcribed using voice recognition software. Every effort was made to ensure accuracy; however, inadvertent computerized transcription errors may still be present.

## 2023-07-05 NOTE — ED Notes (Signed)
Patient transported to CT

## 2023-07-05 NOTE — ED Provider Notes (Signed)
[place] EMERGENCY DEPARTMENT AT [facility] Provider Note   CSN: [id number] Arrival date & time: 07/05/23  1046     History  Chief Complaint  Patient presents with   Abdominal Pain    Derek Hoffman is a 33 y.o. male.  With history of anxiety, depression presenting to the ED for evaluation of abdominal pain.  He is wondering if this is secondary to a recent medication change.  He was started on Prozac and Abilify 4 days ago.  His symptoms began that day.  Pain is localized to the left lower quadrant, constant and describes a sharp and stabbing sensation.  No aggravating or alleviating factors.  Not associated with eating or defecation.  He denies any fevers or chills.  He has had numerous episodes of vomiting.  Most recent episode of emesis was blood-tinged.  He denies any diarrhea, constipation, melena, hematochezia.  He has been taking Pepto-Bismol with minimal improvement.   Abdominal Pain Associated symptoms: vomiting        Home Medications Prior to Admission medications   Medication Sig Start Date End Date Taking? Authorizing Provider  dicyclomine (BENTYL) 20 MG tablet Take 1 tablet (20 mg total) by mouth 2 (two) times daily. 07/05/23  Yes Novia Lansberry, Edsel Petrin, PA-C  ondansetron (ZOFRAN-ODT) 4 MG disintegrating tablet Take 1 tablet (4 mg total) by mouth every 8 (eight) hours as needed for nausea or vomiting. 07/05/23  Yes Gabor Lusk, Edsel Petrin, PA-C  escitalopram (LEXAPRO) 10 MG tablet Take 1 tablet (10 mg total) by mouth daily. 06/26/23   Remington, Amber E, NP  hydrOXYzine (ATARAX) 50 MG tablet Take 1 tablet (50 mg total) by mouth 3 (three) times daily as needed for anxiety. 06/25/23   Remington, Amber E, NP  traZODone (DESYREL) 50 MG tablet Take 1 tablet (50 mg total) by mouth at bedtime as needed for sleep. 06/25/23   Lovette Cliche, NP      Allergies    Cymbalta [duloxetine hcl]    Review of Systems   Review of Systems  Gastrointestinal:  Positive for  abdominal pain and vomiting.  All other systems reviewed and are negative.   Physical Exam Updated Vital Signs BP 118/89   Pulse 71   Temp 97.9 F (36.6 C)   Resp 18   SpO2 95%  Physical Exam Vitals and nursing note reviewed.  Constitutional:      General: He is not in acute distress.    Appearance: Normal appearance. He is normal weight. He is not ill-appearing.     Comments: Resting comfortably in bed  HENT:     Head: Normocephalic and atraumatic.  Cardiovascular:     Rate and Rhythm: Normal rate.  Pulmonary:     Effort: Pulmonary effort is normal. No respiratory distress.  Abdominal:     General: Abdomen is flat.     Tenderness: There is abdominal tenderness in the periumbilical area, suprapubic area and left lower quadrant. Negative signs include McBurney's sign, psoas sign and obturator sign.  Musculoskeletal:        General: Normal range of motion.     Cervical back: Neck supple.  Skin:    General: Skin is warm and dry.  Neurological:     Mental Status: He is alert and oriented to person, place, and time.  Psychiatric:        Mood and Affect: Mood normal.        Behavior: Behavior normal.     ED Results / Procedures / Treatments  Labs (all labs ordered are listed, but only abnormal results are displayed) Labs Reviewed  COMPREHENSIVE METABOLIC PANEL - Abnormal; Notable for the following components:      Result Value   AST 14 (*)    All other components within normal limits  CBC - Abnormal; Notable for the following components:   RBC 5.96 (*)    Hemoglobin 17.5 (*)    All other components within normal limits  URINALYSIS, ROUTINE W REFLEX MICROSCOPIC - Abnormal; Notable for the following components:   Color, Urine COLORLESS (*)    Specific Gravity, Urine <1.005 (*)    All other components within normal limits  LIPASE, BLOOD    EKG None  Radiology CT ABDOMEN PELVIS W CONTRAST Result Date: 07/05/2023 CLINICAL DATA:  Left lower quadrant abdominal  pain.  Nausea. EXAM: CT ABDOMEN AND PELVIS WITH CONTRAST TECHNIQUE: Multidetector CT imaging of the abdomen and pelvis was performed using the standard protocol following bolus administration of intravenous contrast. RADIATION DOSE REDUCTION: This exam was performed according to the departmental dose-optimization program which includes automated exposure control, adjustment of the mA and/or kV according to patient size and/or use of iterative reconstruction technique. CONTRAST:  OMNIPAQUE IOHEXOL 300 MG/ML  SOLN COMPARISON:  CT abdomen/pelvis dated August 28, 2016. FINDINGS: Lower chest: No acute abnormality. Hepatobiliary: No focal liver abnormality is seen. No gallstones, gallbladder wall thickening, or biliary dilatation. Pancreas: Unremarkable. No pancreatic ductal dilatation or surrounding inflammatory changes. Spleen: Normal in size without focal abnormality. Adrenals/Urinary Tract: Adrenal glands are unremarkable. Kidneys are normal, without renal calculi, focal lesion, or hydronephrosis. Bladder is unremarkable. Stomach/Bowel: Stomach is within normal limits. Appendix appears normal. No evidence of bowel wall thickening, distention, or inflammatory changes. Vascular/Lymphatic: No significant vascular findings are present. No enlarged abdominal or pelvic lymph nodes. Reproductive: Prostate is unremarkable. Other: No abdominal wall hernia or abnormality. No abdominopelvic ascites. No intraperitoneal free air. Musculoskeletal: No acute or significant osseous findings. IMPRESSION: No acute localizing findings in the abdomen or pelvis. Electronically Signed   By: Hart Robinsons M.D.   On: 07/05/2023 15:49    Procedures Procedures    Medications Ordered in ED Medications  ondansetron (ZOFRAN-ODT) disintegrating tablet 4 mg (4 mg Oral Given 07/05/23 1152)  morphine (PF) 4 MG/ML injection 4 mg (4 mg Intravenous Given 07/05/23 1454)  iohexol (OMNIPAQUE) 300 MG/ML solution 100 mL (100 mLs Intravenous  Contrast Given 07/05/23 1510)    ED Course/ Medical Decision Making/ A&P                                 Medical Decision Making Amount and/or Complexity of Data Reviewed Labs: ordered. Radiology: ordered.  Risk Prescription drug management.  This patient presents to the ED for concern of Abdominal pain, this involves an extensive number of treatment options, and is a complaint that carries with it a high risk of complications and morbidity. The differential diagnosis for generalized abdominal pain includes, but is not limited to AAA, gastroenteritis, appendicitis, Bowel obstruction, Bowel perforation. Gastroparesis, DKA, Hernia, Inflammatory bowel disease, mesenteric ischemia, pancreatitis, peritonitis SBP, volvulus.   My initial workup includes labs, imaging, symptom control  Additional history obtained from: Nursing notes from this visit.  I ordered, reviewed and interpreted labs which include: CBC, CMP, lipase, urinalysis.  No leukocytosis, polycythemia of 17.5.  Normal lipase.  No significant electrolyte derangement or kidney dysfunction.  No significant LFT abnormality.  Urine without evidence of  infection.  I ordered imaging studies including CT abdomen pelvis I independently visualized and interpreted imaging which showed negative I agree with the radiologist interpretation  Afebrile, hemodynamically stable.  33 year old male presenting to the ED for evaluation of left lower quadrant abdominal pain.  He believes this is due to his new antipsychotic medications.  He has had a couple episodes of emesis over the past couple days.  Lab workup was reassuring.  Urine without evidence of infection.  CT abdomen pelvis negative.  Question gastroenteritis versus medication side effect.  He was encouraged to call his psychiatrist regarding continued management.  He reported significant improvement in his symptoms after treatment in the emergency department.  Lower suspicion for acute emergent  intra-abdominal abnormalities.  He was given return precautions.  Stable at discharge.  At this time there does not appear to be any evidence of an acute emergency medical condition and the patient appears stable for discharge with appropriate outpatient follow up. Diagnosis was discussed with patient who verbalizes understanding of care plan and is agreeable to discharge. I have discussed return precautions with patient who verbalizes understanding. Patient encouraged to follow-up with their PCP within 1 week. All questions answered.  Note: Portions of this report may have been transcribed using voice recognition software. Every effort was made to ensure accuracy; however, inadvertent computerized transcription errors may still be present.         Final Clinical Impression(s) / ED Diagnoses Final diagnoses:  LLQ abdominal pain  Nausea and vomiting, unspecified vomiting type    Rx / DC Orders ED Discharge Orders          Ordered    ondansetron (ZOFRAN-ODT) 4 MG disintegrating tablet  Every 8 hours PRN        07/05/23 1606    dicyclomine (BENTYL) 20 MG tablet  2 times daily        07/05/23 [address] Cordelia Poche 07/05/23 1606    Tegeler, Canary Brim, MD 07/06/23 [phone]

## 2023-07-11 ENCOUNTER — Other Ambulatory Visit: Payer: Self-pay

## 2023-07-11 ENCOUNTER — Encounter (HOSPITAL_COMMUNITY): Payer: Self-pay

## 2023-07-11 ENCOUNTER — Emergency Department (HOSPITAL_COMMUNITY)
Admission: EM | Admit: 2023-07-11 | Discharge: 2023-07-11 | Disposition: A | Discharge status: Home / Self Care | Payer: 59 | Attending: Student | Admitting: Student

## 2023-07-11 DIAGNOSIS — R748 Abnormal levels of other serum enzymes: Secondary | ICD-10-CM | POA: Diagnosis not present

## 2023-07-11 DIAGNOSIS — M79606 Pain in leg, unspecified: Secondary | ICD-10-CM | POA: Diagnosis not present

## 2023-07-11 DIAGNOSIS — Z789 Other specified health status: Secondary | ICD-10-CM

## 2023-07-11 DIAGNOSIS — G729 Myopathy, unspecified: Secondary | ICD-10-CM | POA: Insufficient documentation

## 2023-07-11 DIAGNOSIS — Z87891 Personal history of nicotine dependence: Secondary | ICD-10-CM | POA: Insufficient documentation

## 2023-07-11 DIAGNOSIS — Z888 Allergy status to other drugs, medicaments and biological substances status: Secondary | ICD-10-CM | POA: Diagnosis not present

## 2023-07-11 LAB — COMPREHENSIVE METABOLIC PANEL
ALT: 34 U/L (ref 0–44)
AST: 30 U/L (ref 15–41)
Albumin: 4.1 g/dL (ref 3.5–5.0)
Alkaline Phosphatase: 62 U/L (ref 38–126)
Anion gap: 14 (ref 5–15)
BUN: 9 mg/dL (ref 6–20)
CO2: 24 mmol/L (ref 22–32)
Calcium: 9.5 mg/dL (ref 8.9–10.3)
Chloride: 104 mmol/L (ref 98–111)
Creatinine, Ser: 1.14 mg/dL (ref 0.61–1.24)
GFR, Estimated: >60 mL/min (ref >60)
Glucose, Bld: 105 mg/dL — ABNORMAL HIGH (ref 70–99)
Potassium: 3.1 mmol/L — ABNORMAL LOW (ref 3.5–5.1)
Sodium: 142 mmol/L (ref 135–145)
Total Bilirubin: 0.5 mg/dL (ref 0.0–1.2)
Total Protein: 7 g/dL (ref 6.5–8.1)

## 2023-07-11 LAB — URINALYSIS, ROUTINE W REFLEX MICROSCOPIC
Bilirubin Urine: NEGATIVE
Glucose, UA: NEGATIVE mg/dL
Hgb urine dipstick: NEGATIVE
Ketones, ur: NEGATIVE mg/dL
Leukocytes,Ua: NEGATIVE
Nitrite: NEGATIVE
Protein, ur: NEGATIVE mg/dL
Specific Gravity, Urine: 1.02 (ref 1.005–1.030)
pH: 6 (ref 5.0–8.0)

## 2023-07-11 LAB — CBC WITH DIFFERENTIAL/PLATELET
Abs Immature Granulocytes: 0.03 10*3/uL (ref 0.00–0.07)
Basophils Absolute: 0 10*3/uL (ref 0.0–0.1)
Basophils Relative: 0 %
Eosinophils Absolute: 0 10*3/uL (ref 0.0–0.5)
Eosinophils Relative: 0 %
HCT: 48.1 % (ref 39.0–52.0)
Hemoglobin: 16.5 g/dL (ref 13.0–17.0)
Immature Granulocytes: 0 %
Lymphocytes Relative: 25 %
Lymphs Abs: 2.1 10*3/uL (ref 0.7–4.0)
MCH: 29.4 pg (ref 26.0–34.0)
MCHC: 34.3 g/dL (ref 30.0–36.0)
MCV: 85.7 fL (ref 80.0–100.0)
Monocytes Absolute: 0.8 10*3/uL (ref 0.1–1.0)
Monocytes Relative: 9 %
Neutro Abs: 5.4 10*3/uL (ref 1.7–7.7)
Neutrophils Relative %: 66 %
Platelets: 212 10*3/uL (ref 150–400)
RBC: 5.61 MIL/uL (ref 4.22–5.81)
RDW: 12.6 % (ref 11.5–15.5)
WBC: 8.3 10*3/uL (ref 4.0–10.5)
nRBC: 0 % (ref 0.0–0.2)

## 2023-07-11 LAB — CK: Total CK: 419 U/L — ABNORMAL HIGH (ref 49–397)

## 2023-07-11 MED ORDER — LORAZEPAM 1 MG PO TABS
0.5000 mg | ORAL_TABLET | Freq: Once | ORAL | Status: AC
  Administered 2023-07-11: 0.5 mg via ORAL
  Filled 2023-07-11: qty 1

## 2023-07-11 NOTE — ED Provider Triage Note (Signed)
Emergency Medicine Provider Triage Evaluation Note  Derek Hoffman , a 33 y.o. male  was evaluated in triage.  Pt complains of myalgias. States that same began when he woke up this morning. Pain is in his bilateral lower legs.  Also notes feeling restless and "brain fog." Went to urgent care for this and was told to come here with concern for serotonin syndrome.  He started Prozac 10 days ago and then [employer] 3 days ago.  States he was having nausea and vomiting soon after he started the Prozac but none since then.  No headache, vision changes, chest pain, shortness of breath, abdominal pain.  No urinary symptoms.   Review of Systems  Positive:  Negative:   Physical Exam  BP (!) 157/104   Pulse 98   Temp 98.3 F (36.8 C) (Oral)   Resp 14   SpO2 100%  Gen:   Awake, no distress   Resp:  Normal effort  MSK:   Moves extremities without difficulty  Other:  Ambulatory, well appearing, alert and oriented  Medical Decision Making  Medically screening exam initiated at 5:29 PM.  Appropriate orders placed.  Derek Hoffman was informed that the remainder of the evaluation will be completed by another provider, this initial triage assessment does not replace that evaluation, and the importance of remaining in the ED until their evaluation is complete.     Silva Bandy, PA-C 07/11/23 1733

## 2023-07-11 NOTE — ED Triage Notes (Signed)
Pt c.o bilateral leg cramping/pain since last night, pt has been taking prozac for the past 10 days and vraylar for the past 3 nights and UC sent him here for possible serotonin syndrome.  Pt also c.o dizziness and intermittent nausea.

## 2023-07-11 NOTE — ED Notes (Signed)
PT already discharged out of the system.Wasted .5mg  of ativan with Kuch RN since PT was out of the system

## 2023-07-12 NOTE — ED Provider Notes (Signed)
[place] EMERGENCY DEPARTMENT AT [facility] Provider Note  CSN: [id number] Arrival date & time: 07/11/23 1606  Chief Complaint(s) Leg Pain  HPI Glenwood Revoir is a 33 y.o. male with PMH anxiety, depression on Vraylar and Prozac who presents emergency department for evaluation of stiffness, muscle pain and urgent care concern for serotonin syndrome.  Patient has been on multiple serotonergic agents over the last 2 months with various side effects.  Was recently started on Prozac and Vraylar and over the last 24 hours has developed pain in the proximal muscles over the thighs and some mild stiffness.  Went to urgent care who had concern for possible serotonin syndrome.  Patient has been able to ambulate without difficulty and is not displaying any rigidity in the emergency department today.  Denies associated chest pain, shortness of breath, abdominal pain, nausea, vomiting, fever or other systemic symptoms.   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   Depression    History of MRSA infection 2015   left arm   Lateral meniscus tear 12/2014   left   Patient Active Problem List   Diagnosis Date Noted   Severe recurrent major depressive disorder with psychotic features (HCC) 06/22/2023   Generalized anxiety disorder 06/22/2023   Major depressive disorder, recurrent severe without psychotic features (HCC) 06/22/2023   Diarrhea 11/27/2022   Generalized abdominal pain 11/27/2022   Nausea 11/27/2022   Post concussive syndrome 01/21/2021   Olecranon bursitis of left elbow 03/31/2017   Strain of left quadriceps 09/03/2015   Other specified postprocedural states 05/28/2015   Post-traumatic osteoarthritis of left knee 03/20/2015   Chronic pain of left knee 03/06/2015   General medical examination 07/10/2011   Overweight 12/26/2010   Anxiety 12/18/2010   Home Medication(s) Prior to Admission medications    Medication Sig Start Date End Date Taking? Authorizing Provider  dicyclomine (BENTYL) 20 MG tablet Take 1 tablet (20 mg total) by mouth 2 (two) times daily. 07/05/23   Schutt, Edsel Petrin, PA-C  escitalopram (LEXAPRO) 10 MG tablet Take 1 tablet (10 mg total) by mouth daily. 06/26/23   Remington, Amber E, NP  hydrOXYzine (ATARAX) 50 MG tablet Take 1 tablet (50 mg total) by mouth 3 (three) times daily as needed for anxiety. 06/25/23   Remington, Amber E, NP  ondansetron (ZOFRAN-ODT) 4 MG disintegrating tablet Take 1 tablet (4 mg total) by mouth every 8 (eight) hours as needed for nausea or vomiting. 07/05/23   Schutt, Edsel Petrin, PA-C  traZODone (DESYREL) 50 MG tablet Take 1 tablet (50 mg total) by mouth at bedtime as needed for sleep. 06/25/23   Lovette Cliche, NP                                                                                                                                    Past Surgical History  Past Surgical History:  Procedure Laterality Date   CHONDROPLASTY Left 01/11/2015   Procedure: CHONDROPLASTY;  Surgeon: [name], MD;  Location: [facility];  Service: Orthopedics;  Laterality: Left;   FEMUR HARDWARE REMOVAL Left 10/28/2007   KNEE ARTHROSCOPY Left 10/28/2007   with manipulation   KNEE ARTHROSCOPY WITH EXCISION PLICA Left 01/11/2015   Procedure: KNEE ARTHROSCOPY WITH EXCISION PLICA;  Surgeon: [name], MD;  Location: [facility];  Service: Orthopedics;  Laterality: Left;   KNEE ARTHROSCOPY WITH LATERAL MENISECTOMY Left 01/11/2015   Procedure: LEFT KNEE ARTHROSCOPY  WITH LATERAL MENISCECTOMY, EXCISION OF MEDIAL PLICA, CHONDROPLASTY;  Surgeon: [name], MD;  Location: [facility];  Service: Orthopedics;  Laterality: Left;   NASAL SEPTOPLASTY W/ TURBINOPLASTY Bilateral 10/08/2020   Procedure: REVISION NASAL SEPTOPLASTY WITH TURBINATE REDUCTION AND SEPTUM REVISION;  Surgeon: Newman Pies, MD;  Location: [facility];  Service: ENT;  Laterality: Bilateral;   ORIF FEMUR FRACTURE Left    SEPTOPLASTY     SHOULDER ARTHROSCOPY WITH ROTATOR CUFF REPAIR Left 06/27/2021   Procedure: Left Shoulder arthroscopy, debridement, subacromial decompression, distal clavicle resection,;  Surgeon: Francena Hanly, MD;  Location: [facility];  Service: Orthopedics;  Laterality: Left;    TONSILLECTOMY AND ADENOIDECTOMY     WISDOM TOOTH EXTRACTION     Family History Family History  Problem Relation Age of Onset   Hypertension Father    Migraines Neg Hx     Social History Social History   Tobacco Use   Smoking status: Former    Current packs/day: 0.00    Average packs/day: 0.3 packs/day for 1 year (0.3 ttl pk-yrs)    Types: Cigarettes    Start date: 2011    Quit date: 2012    Years since quitting: 13.0   Smokeless tobacco: Former    Types: Snuff    Quit date: 2021  Vaping Use   Vaping status: Every Day   Substances: Nicotine  Substance Use Topics   Alcohol use: Not Currently    Comment: occasionally   Drug use: No   Allergies Cymbalta [duloxetine hcl]  Review of Systems Review of Systems  Musculoskeletal:  Positive for myalgias.    Physical Exam Vital Signs  I have reviewed the triage vital signs BP (!) 157/104   Pulse 98   Temp 98.3 F (36.8 C) (Oral)   Resp 14   SpO2 100%   Physical Exam Constitutional:      General: He is not in acute distress.    Appearance: Normal appearance.  HENT:     Head: Normocephalic and atraumatic.     Nose: No congestion or rhinorrhea.  Eyes:     General:        Right eye: No discharge.        Left eye: No discharge.     Extraocular Movements: Extraocular movements intact.     Pupils: Pupils are equal, round, and reactive to light.  Cardiovascular:     Rate and Rhythm: Normal rate and regular rhythm.     Heart sounds: No murmur heard. Pulmonary:     Effort: No respiratory distress.     Breath sounds: No wheezing or rales.  Abdominal:      General: There is no distension.     Tenderness: There is no abdominal tenderness.  Musculoskeletal:        General: Tenderness present. Normal range of motion.     Cervical back: Normal range  of motion.  Skin:    General: Skin is warm and dry.  Neurological:     General: No focal deficit present.     Mental Status: He is alert.     ED Results and Treatments Labs (all labs ordered are listed, but only abnormal results are displayed) Labs Reviewed  COMPREHENSIVE METABOLIC PANEL - Abnormal; Notable for the following components:      Result Value   Potassium 3.1 (*)    Glucose, Bld 105 (*)    All other components within normal limits  CK - Abnormal; Notable for the following components:   Total CK 419 (*)    All other components within normal limits  CBC WITH DIFFERENTIAL/PLATELET  URINALYSIS, ROUTINE W REFLEX MICROSCOPIC                                                                                                                          Radiology No results found.  Pertinent labs & imaging results that were available during my care of the patient were reviewed by me and considered in my medical decision making (see MDM for details).  Medications Ordered in ED Medications  LORazepam (ATIVAN) tablet 0.5 mg (0.5 mg Oral Given 07/11/23 2022)                                                                                                                                     Procedures Procedures  (including critical care time)  Medical Decision Making / ED Course   This patient presents to the ED for concern of muscle pain, this involves an extensive number of treatment options, and is a complaint that carries with it a high risk of complications and morbidity.  The differential diagnosis includes drug-induced myopathy, contusion, serotonin syndrome, rhabdomyolysis  MDM: Patient seen emergency room for evaluation of muscle pain.  Physical exam with some mild tenderness of  the proximal muscles bilaterally, mild hyperreflexia but no clonus and no muscle rigidity on exam.  Laboratory evaluation with mild hypokalemia to 3.1 and a CK elevation to 419.  Creatinine is normal and low suspicion for rhabdomyolysis at this time.  Patient given a small dose of lorazepam with improvement of muscle stiffness.  I spoke with the psychiatric nurse practitioner on-call Sindy Guadeloupe and we discussed the case and agreed the patient would be safe to discontinue both the Vraylar and the Prozac in  an attempt to let all serotonergic medications wear off.  Patient could possibly be displaying the very beginnings of serotonin syndrome with his hyperreflexia and we will start by removing the underlying offending agent.  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  There is no evidence of kidney damage or rhabdomyolysis and thus IV hydration was not performed.  At this time he does not meet inpatient criteria for admission and will reach out to his psychiatrist for follow-up.  Return precautions given of which she voiced understanding and patient discharged   Additional history obtained:  -External records from outside source obtained and reviewed including: Chart review including previous notes, labs, imaging, consultation notes   Lab Tests: -I ordered, reviewed, and interpreted labs.   The pertinent results include:   Labs Reviewed  COMPREHENSIVE METABOLIC PANEL - Abnormal; Notable for the following components:      Result Value   Potassium 3.1 (*)    Glucose, Bld 105 (*)    All other components within normal limits  CK - Abnormal; Notable for the following components:   Total CK 419 (*)    All other components within normal limits  CBC WITH DIFFERENTIAL/PLATELET  URINALYSIS, ROUTINE W REFLEX MICROSCOPIC     Medicines ordered and prescription drug management: Meds ordered this encounter  Medications   LORazepam  (ATIVAN) tablet 0.5 mg    -I have reviewed the patients home medicines and have made adjustments as needed  Critical interventions none  Consultations Obtained: I requested consultation with the 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

## 2023-07-13 ENCOUNTER — Encounter (HOSPITAL_COMMUNITY): Payer: Self-pay

## 2023-07-13 ENCOUNTER — Other Ambulatory Visit: Payer: Self-pay

## 2023-07-13 ENCOUNTER — Emergency Department (HOSPITAL_COMMUNITY)
Admission: EM | Admit: 2023-07-13 | Discharge: 2023-07-13 | Discharge status: Against Medical Advice | Payer: 59 | Attending: Emergency Medicine | Admitting: Emergency Medicine

## 2023-07-13 DIAGNOSIS — Z5321 Procedure and treatment not carried out due to patient leaving prior to being seen by health care provider: Secondary | ICD-10-CM | POA: Insufficient documentation

## 2023-07-13 DIAGNOSIS — G4700 Insomnia, unspecified: Secondary | ICD-10-CM | POA: Insufficient documentation

## 2023-07-13 DIAGNOSIS — R110 Nausea: Secondary | ICD-10-CM | POA: Diagnosis not present

## 2023-07-13 LAB — URINALYSIS, ROUTINE W REFLEX MICROSCOPIC
Bilirubin Urine: NEGATIVE
Glucose, UA: NEGATIVE mg/dL
Hgb urine dipstick: NEGATIVE
Ketones, ur: NEGATIVE mg/dL
Leukocytes,Ua: NEGATIVE
Nitrite: NEGATIVE
Protein, ur: NEGATIVE mg/dL
Specific Gravity, Urine: 1.002 — ABNORMAL LOW (ref 1.005–1.030)
pH: 6 (ref 5.0–8.0)

## 2023-07-13 LAB — COMPREHENSIVE METABOLIC PANEL
ALT: 35 U/L (ref 0–44)
AST: 23 U/L (ref 15–41)
Albumin: 4.3 g/dL (ref 3.5–5.0)
Alkaline Phosphatase: 60 U/L (ref 38–126)
Anion gap: 11 (ref 5–15)
BUN: 6 mg/dL (ref 6–20)
CO2: 24 mmol/L (ref 22–32)
Calcium: 9.5 mg/dL (ref 8.9–10.3)
Chloride: 105 mmol/L (ref 98–111)
Creatinine, Ser: 0.95 mg/dL (ref 0.61–1.24)
GFR, Estimated: >60 mL/min (ref >60)
Glucose, Bld: 104 mg/dL — ABNORMAL HIGH (ref 70–99)
Potassium: 3.4 mmol/L — ABNORMAL LOW (ref 3.5–5.1)
Sodium: 140 mmol/L (ref 135–145)
Total Bilirubin: 0.4 mg/dL (ref 0.0–1.2)
Total Protein: 7.2 g/dL (ref 6.5–8.1)

## 2023-07-13 LAB — CBC
HCT: 48.8 % (ref 39.0–52.0)
Hemoglobin: 16.5 g/dL (ref 13.0–17.0)
MCH: 28.8 pg (ref 26.0–34.0)
MCHC: 33.8 g/dL (ref 30.0–36.0)
MCV: 85.2 fL (ref 80.0–100.0)
Platelets: 224 10*3/uL (ref 150–400)
RBC: 5.73 MIL/uL (ref 4.22–5.81)
RDW: 12.7 % (ref 11.5–15.5)
WBC: 7.9 10*3/uL (ref 4.0–10.5)
nRBC: 0 % (ref 0.0–0.2)

## 2023-07-13 LAB — CK: Total CK: 113 U/L (ref 49–397)

## 2023-07-13 LAB — MAGNESIUM: Magnesium: 2.3 mg/dL (ref 1.7–2.4)

## 2023-07-13 NOTE — ED Provider Triage Note (Signed)
Emergency Medicine Provider Triage Evaluation Note  Derek Hoffman , a 33 y.o. male  was evaluated in triage.  Pt complains of nausea, muscle aches, insomnia. Was seen for similar 2 days ago, diagnosed with maybe early serotonin syndrome. Stopped prozac and Vraylar, has since stopped those. Continues to have symptoms.   Review of Systems  Positive: As above Negative:   Physical Exam  BP (!) 140/102 (BP Location: Right Arm)   Pulse 87   Temp 98 F (36.7 C) (Oral)   Resp 18   Ht 5\' 11"  (1.803 m)   Wt 108 kg   SpO2 100%   BMI 33.19 kg/m  Gen:   Awake, no distress   Resp:  Normal effort  MSK:   Moves extremities without difficulty  Other:    Medical Decision Making  Medically screening exam initiated at 2:49 PM.  Appropriate orders placed.  Derek Hoffman was informed that the remainder of the evaluation will be completed by another provider, this initial triage assessment does not replace that evaluation, and the importance of remaining in the ED until their evaluation is complete.  Workup initiated   Derek Salais T, PA-C 07/13/23 1450

## 2023-07-13 NOTE — ED Notes (Signed)
 Pt left d/t wait time

## 2023-07-13 NOTE — ED Triage Notes (Signed)
Pt c/o bilateral leg cramping, nauseated, and not able to sleep. Pt seen for similar 2 days ago and diagnosed with serotonin syndrome. Pt has stopped previous medication but symptoms are not improving.

## 2023-07-15 DIAGNOSIS — F332 Major depressive disorder, recurrent severe without psychotic features: Secondary | ICD-10-CM | POA: Diagnosis not present

## 2023-07-15 DIAGNOSIS — F410 Panic disorder [episodic paroxysmal anxiety] without agoraphobia: Secondary | ICD-10-CM | POA: Diagnosis not present

## 2023-07-15 DIAGNOSIS — F411 Generalized anxiety disorder: Secondary | ICD-10-CM | POA: Diagnosis not present

## 2023-07-16 ENCOUNTER — Ambulatory Visit: Payer: 59 | Admitting: Mental Health

## 2023-07-16 DIAGNOSIS — Z79899 Other long term (current) drug therapy: Secondary | ICD-10-CM | POA: Diagnosis not present

## 2023-07-16 DIAGNOSIS — G4700 Insomnia, unspecified: Secondary | ICD-10-CM | POA: Diagnosis not present

## 2023-07-16 DIAGNOSIS — Z8659 Personal history of other mental and behavioral disorders: Secondary | ICD-10-CM | POA: Diagnosis not present

## 2023-07-16 DIAGNOSIS — F419 Anxiety disorder, unspecified: Secondary | ICD-10-CM | POA: Diagnosis not present

## 2023-07-16 NOTE — Progress Notes (Signed)
 No show for initial session 07/16/23.

## 2023-07-28 DIAGNOSIS — R569 Unspecified convulsions: Secondary | ICD-10-CM | POA: Diagnosis not present

## 2023-07-28 DIAGNOSIS — F419 Anxiety disorder, unspecified: Secondary | ICD-10-CM | POA: Diagnosis not present

## 2023-07-30 ENCOUNTER — Encounter: Payer: Self-pay | Admitting: Neurology

## 2023-07-30 ENCOUNTER — Ambulatory Visit (INDEPENDENT_AMBULATORY_CARE_PROVIDER_SITE_OTHER): Payer: 59 | Admitting: Neurology

## 2023-07-30 ENCOUNTER — Telehealth: Payer: Self-pay | Admitting: Neurology

## 2023-07-30 VITALS — BP 148/94 | HR 113 | Ht 71.0 in | Wt 256.0 lb

## 2023-07-30 DIAGNOSIS — R569 Unspecified convulsions: Secondary | ICD-10-CM | POA: Diagnosis not present

## 2023-07-30 DIAGNOSIS — Z87820 Personal history of traumatic brain injury: Secondary | ICD-10-CM | POA: Diagnosis not present

## 2023-07-30 DIAGNOSIS — M6240 Contracture of muscle, unspecified site: Secondary | ICD-10-CM | POA: Diagnosis not present

## 2023-07-30 DIAGNOSIS — R4182 Altered mental status, unspecified: Secondary | ICD-10-CM | POA: Diagnosis not present
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
## 2023-07-30 NOTE — Patient Instructions (Signed)
 Per [facility] statutes, patients with seizures are not allowed to drive until they have been seizure-free for six months.      Non-Epileptic Seizures in Adults: How to Manage A non-epileptic seizure is an event that can cause unusual body movements or a loss of consciousness. Unlike epileptic seizures, these seizures aren't caused by abnormal activity in the brain. Non-epileptic seizures may be physiologic. This means they're caused by an underlying problem with how the body works. Or they may be psychogenic. This type happens because of problems with mental health. If you have non-epileptic seizures, you can do things to manage your symptoms. You may need to try different things to see what works best for you. Your health care provider may also give you more instructions. How does this condition affect me? If your seizures are physiologic, your provider will treat the cause. These seizures aren't likely to return, and they don't need further treatment. If you have psychogenic non-epileptic seizures (PNES), it's important to understand that your PNES is a real illness. Your seizures are not fake. They just have a different cause than other seizures. PNES can be treated. Work with your mental health provider to find a treatment that works for you. Talk with your provider about what activities are safe to do until your seizures are controlled. What actions can I take to manage the condition? Make a plan for how to deal with your seizures. After a seizure, make notes about what caused the stress that led to the seizure. Then, make a plan to manage this stress. Think of ways to change the stresses you can't avoid. How to manage lifestyle changes Managing stress Find ways to manage stress. These may include: Certain types of counseling. Other treatments. A mental health provider can suggest treatments that may also help. These may include: Talk therapy. This is also called cognitive  behavioral therapy, or CBT. Through CBT, you'll learn to identify and manage the mental distress that leads to seizures. Medicine to treat depression or anxiety. Biofeedback. This uses signals from your body (physiologic responses) to help you learn to manage anxiety. Self-care methods to lower stress levels. These may include: Doing breathing exercises, yoga, or meditation. Listening to music. Doing recreational therapy or organized exercise. Giving yourself calming messages. Calling a friend to talk about your stress.  Relationships  You may benefit from talking with a trusted friend or family member about your thoughts and feelings. Ask for the emotional support you would like. Think about teaching the people in your life about your condition. These should be trusted family members and others who spend time with you at work, school, or home. To help with this, learn as much as you can about your seizures. Follow these instructions at home: Medicines Medicines may be prescribed to treat depression or anxiety that causes non-epileptic seizures. Make sure you: Take medicines only as told. Avoid using alcohol and other substances that may prevent your medicines from working well. Talk with your pharmacist or provider about all the medicines you take. Discuss their possible side effects and what medicines are safe to take together. Try to take part in all decisions about your treatment. This is called shared decision-making. When your provider suggests medicines, ask about possible side effects and discuss how you feel about them. It's best if shared decision-making with your provider is part of your total treatment plan. General instructions Ask your provider if it's safe for you to drive. Ask what things are safe for  you to do at home. Ask when you can go back to work or school. Follow all instructions from your provider. These may include ways to prevent seizures and what to do if you have  a seizure. Eat a balanced diet. Make sure you get full nights of sleep and regular daily exercise. Keep all follow-up visits. Your provider will check to make sure the treatments are still working. Where to find support You can get support for managing non-epileptic seizures from support groups. These can be either online or in-person. Your provider may be able to suggest a support group in your area. Where to find more information Epilepsy Foundation: epilepsy.com Contact a health care provider if: Your seizures change or happen more often. You keep having seizures after treatment. You show signs of depression or anxiety. You have trouble doing your normal daily routine or work schedule. Get help right away if: You injure yourself during a seizure. You have or someone sees you have: One seizure after another. A seizure that lasts longer than 5 minutes. You have trouble recovering from a seizure. You have chest pain or trouble breathing. These symptoms may be an emergency. Call 911 right away. Do not wait to see if the symptoms will go away. Do not drive yourself to the hospital. Also, get help right away if: You feel like you may hurt yourself or others. You have thoughts about taking your own life. You have other thoughts or feelings that worry you. Take one of these steps right away: Go to your nearest emergency room. Call 911. Call the Suicide & Crisis Lifeline (free and confidential): Call 515-626-6712 or 988. Text 713 226 7869. This information is not intended to replace advice given to you by your health care provider. Make sure you discuss any questions you have with your health care provider. Document Revised: 11/09/2022 Document Reviewed: 11/09/2022 Elsevier Patient Education  2024 [employer].

## 2023-07-30 NOTE — Telephone Encounter (Signed)
 sent to GI they obtain Derek Hoffman [phone]

## 2023-07-31 DIAGNOSIS — F411 Generalized anxiety disorder: Secondary | ICD-10-CM | POA: Diagnosis not present

## 2023-07-31 DIAGNOSIS — F410 Panic disorder [episodic paroxysmal anxiety] without agoraphobia: Secondary | ICD-10-CM | POA: Diagnosis not present

## 2023-07-31 DIAGNOSIS — F332 Major depressive disorder, recurrent severe without psychotic features: Secondary | ICD-10-CM | POA: Diagnosis not present

## 2023-08-01 ENCOUNTER — Other Ambulatory Visit: Payer: Self-pay

## 2023-08-01 ENCOUNTER — Encounter (HOSPITAL_COMMUNITY): Payer: Self-pay | Admitting: Emergency Medicine

## 2023-08-01 ENCOUNTER — Observation Stay (HOSPITAL_COMMUNITY)
Admission: EM | Admit: 2023-08-01 | Discharge: 2023-08-03 | Disposition: A | Discharge status: Home / Self Care | Payer: 59 | Attending: Internal Medicine | Admitting: Internal Medicine

## 2023-08-01 ENCOUNTER — Emergency Department (HOSPITAL_COMMUNITY): Payer: 59

## 2023-08-01 DIAGNOSIS — R519 Headache, unspecified: Secondary | ICD-10-CM | POA: Insufficient documentation

## 2023-08-01 DIAGNOSIS — Z87891 Personal history of nicotine dependence: Secondary | ICD-10-CM | POA: Insufficient documentation

## 2023-08-01 DIAGNOSIS — R569 Unspecified convulsions: Principal | ICD-10-CM

## 2023-08-01 DIAGNOSIS — E669 Obesity, unspecified: Secondary | ICD-10-CM | POA: Insufficient documentation

## 2023-08-01 DIAGNOSIS — F332 Major depressive disorder, recurrent severe without psychotic features: Secondary | ICD-10-CM | POA: Diagnosis not present

## 2023-08-01 DIAGNOSIS — F23 Brief psychotic disorder: Secondary | ICD-10-CM

## 2023-08-01 DIAGNOSIS — F419 Anxiety disorder, unspecified: Secondary | ICD-10-CM | POA: Insufficient documentation

## 2023-08-01 DIAGNOSIS — Z6835 Body mass index (BMI) 35.0-35.9, adult: Secondary | ICD-10-CM | POA: Insufficient documentation

## 2023-08-01 DIAGNOSIS — R29818 Other symptoms and signs involving the nervous system: Principal | ICD-10-CM | POA: Insufficient documentation

## 2023-08-01 DIAGNOSIS — R9431 Abnormal electrocardiogram [ECG] [EKG]: Secondary | ICD-10-CM | POA: Diagnosis not present

## 2023-08-01 LAB — COMPREHENSIVE METABOLIC PANEL
ALT: 34 U/L (ref 0–44)
AST: 21 U/L (ref 15–41)
Albumin: 4.2 g/dL (ref 3.5–5.0)
Alkaline Phosphatase: 60 U/L (ref 38–126)
Anion gap: 12 (ref 5–15)
BUN: 6 mg/dL (ref 6–20)
CO2: 24 mmol/L (ref 22–32)
Calcium: 9.6 mg/dL (ref 8.9–10.3)
Chloride: 102 mmol/L (ref 98–111)
Creatinine, Ser: 0.92 mg/dL (ref 0.61–1.24)
GFR, Estimated: >60 mL/min (ref >60)
Glucose, Bld: 131 mg/dL — ABNORMAL HIGH (ref 70–99)
Potassium: 3.4 mmol/L — ABNORMAL LOW (ref 3.5–5.1)
Sodium: 138 mmol/L (ref 135–145)
Total Bilirubin: 0.6 mg/dL (ref 0.0–1.2)
Total Protein: 7.1 g/dL (ref 6.5–8.1)

## 2023-08-01 LAB — CBC WITH DIFFERENTIAL/PLATELET
Abs Immature Granulocytes: 0.01 10*3/uL (ref 0.00–0.07)
Basophils Absolute: 0 10*3/uL (ref 0.0–0.1)
Basophils Relative: 0 %
Eosinophils Absolute: 0 10*3/uL (ref 0.0–0.5)
Eosinophils Relative: 0 %
HCT: 50.4 % (ref 39.0–52.0)
Hemoglobin: 17 g/dL (ref 13.0–17.0)
Immature Granulocytes: 0 %
Lymphocytes Relative: 21 %
Lymphs Abs: 1.5 10*3/uL (ref 0.7–4.0)
MCH: 28.7 pg (ref 26.0–34.0)
MCHC: 33.7 g/dL (ref 30.0–36.0)
MCV: 85 fL (ref 80.0–100.0)
Monocytes Absolute: 0.6 10*3/uL (ref 0.1–1.0)
Monocytes Relative: 8 %
Neutro Abs: 5 10*3/uL (ref 1.7–7.7)
Neutrophils Relative %: 71 %
Platelets: 203 10*3/uL (ref 150–400)
RBC: 5.93 MIL/uL — ABNORMAL HIGH (ref 4.22–5.81)
RDW: 12.4 % (ref 11.5–15.5)
WBC: 7 10*3/uL (ref 4.0–10.5)
nRBC: 0 % (ref 0.0–0.2)

## 2023-08-01 LAB — RAPID URINE DRUG SCREEN, HOSP PERFORMED
Amphetamines: NOT DETECTED
Barbiturates: NOT DETECTED
Benzodiazepines: POSITIVE — AB
Cocaine: NOT DETECTED
Opiates: NOT DETECTED
Tetrahydrocannabinol: NOT DETECTED

## 2023-08-01 LAB — SALICYLATE LEVEL: Salicylate Lvl: <7 mg/dL — ABNORMAL LOW (ref 7.0–30.0)

## 2023-08-01 LAB — CK: Total CK: 53 U/L (ref 49–397)

## 2023-08-01 LAB — ACETAMINOPHEN LEVEL: Acetaminophen (Tylenol), Serum: <10 ug/mL — ABNORMAL LOW (ref 10–30)

## 2023-08-01 LAB — ETHANOL: Alcohol, Ethyl (B): <10 mg/dL (ref <10)

## 2023-08-01 MED ORDER — LORAZEPAM 2 MG/ML IJ SOLN
1.0000 mg | Freq: Once | INTRAMUSCULAR | Status: AC
  Administered 2023-08-01: 1 mg via INTRAVENOUS
  Filled 2023-08-01: qty 1

## 2023-08-01 MED ORDER — KETOROLAC TROMETHAMINE 15 MG/ML IJ SOLN
15.0000 mg | Freq: Once | INTRAMUSCULAR | Status: AC
  Administered 2023-08-01: 15 mg via INTRAVENOUS
  Filled 2023-08-01: qty 1

## 2023-08-01 MED ORDER — GADOBUTROL 1 MMOL/ML IV SOLN
10.0000 mL | Freq: Once | INTRAVENOUS | Status: AC | PRN
  Administered 2023-08-01: 10 mL via INTRAVENOUS
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
## 2023-08-01 NOTE — ED Triage Notes (Addendum)
 Presents from home for seizures, 5 today. Witnessed an episode in triage lasting 10 seconds during which patient shook, snored, and then returned to baseline immediately after.  Recent serotonin syndrome 2 weeks ago which resulted in medication changes. Endorses auditory hallucinations (not voices, just nonspecific buzzing or other sound). Denies current thoughts of harming self or others H/o TBI

## 2023-08-01 NOTE — ED Notes (Signed)
 PA Edyth Gunnels at bedside

## 2023-08-01 NOTE — ED Notes (Signed)
 This RN and Chloe, RN called in to room about seizure like activity.  Pt father at beside reports patient "just passed out."  This RN noted patient looking to side but no motion noted.  Pt able to speak and have conversation with PA Edyth Gunnels 2 minutes later.

## 2023-08-01 NOTE — ED Provider Triage Note (Cosign Needed Addendum)
 Emergency Medicine Provider Triage Evaluation Note  Derek Hoffman , a 33 y.o. male  was evaluated in triage.  Pt complains of seizure like activity.  Review of Systems  Positive:  Negative:   Physical Exam  BP (!) 146/101 (BP Location: Right Arm)   Pulse 89   Temp 98.2 F (36.8 C)   Resp 16   SpO2 99%  Gen:   Awake, no distress   Resp:  Normal effort  MSK:   Moves extremities without difficulty  Other:    Medical Decision Making  Medically screening exam initiated at 3:20 PM.  Appropriate orders placed.  Derek Hoffman was informed that the remainder of the evaluation will be completed by another provider, this initial triage assessment does not replace that evaluation, and the importance of remaining in the ED until their evaluation is complete.  Diagnosed with possible beginnings of serotonin syndrome here in the ED 2 weeks ago, was taken off of medications and started buspirone yesterday. Patient had his first seizure 1 week ago . Has been having at least 1 seizure today, but then had at least 4 seizures today. These seizures are witnessed in triage - they last a couple of seconds and patient quickly regains consciousness. Father keeps stating that patient has had a lot of difficulty with anxiety recently. Patient also taking klonopin and xanax intermittently/PRN to help with anxiety symptoms.   Patient did state that he wished he could die last night when his anxiety was severe, but denies SI/HI today. Endorses auditory hallucinations that sounds like buzzers and people mumbling.    Derek Hoffman, [place] 08/01/23 [phone]

## 2023-08-02 ENCOUNTER — Observation Stay (HOSPITAL_COMMUNITY): Payer: 59

## 2023-08-02 ENCOUNTER — Telehealth: Payer: Self-pay | Admitting: Neurology

## 2023-08-02 DIAGNOSIS — R569 Unspecified convulsions: Secondary | ICD-10-CM | POA: Diagnosis not present

## 2023-08-02 DIAGNOSIS — F332 Major depressive disorder, recurrent severe without psychotic features: Secondary | ICD-10-CM | POA: Diagnosis not present

## 2023-08-02 DIAGNOSIS — F23 Brief psychotic disorder: Secondary | ICD-10-CM

## 2023-08-02 DIAGNOSIS — F419 Anxiety disorder, unspecified: Secondary | ICD-10-CM | POA: Diagnosis not present

## 2023-08-02 LAB — COMPREHENSIVE METABOLIC PANEL
ALT: 30 U/L (ref 0–44)
AST: 20 U/L (ref 15–41)
Albumin: 3.9 g/dL (ref 3.5–5.0)
Alkaline Phosphatase: 53 U/L (ref 38–126)
Anion gap: 10 (ref 5–15)
BUN: 7 mg/dL (ref 6–20)
CO2: 26 mmol/L (ref 22–32)
Calcium: 9.4 mg/dL (ref 8.9–10.3)
Chloride: 103 mmol/L (ref 98–111)
Creatinine, Ser: 1.11 mg/dL (ref 0.61–1.24)
GFR, Estimated: >60 mL/min (ref >60)
Glucose, Bld: 94 mg/dL (ref 70–99)
Potassium: 3.8 mmol/L (ref 3.5–5.1)
Sodium: 139 mmol/L (ref 135–145)
Total Bilirubin: 0.9 mg/dL (ref 0.0–1.2)
Total Protein: 6.5 g/dL (ref 6.5–8.1)

## 2023-08-02 LAB — CBC
HCT: 46.7 % (ref 39.0–52.0)
Hemoglobin: 16.1 g/dL (ref 13.0–17.0)
MCH: 29.3 pg (ref 26.0–34.0)
MCHC: 34.5 g/dL (ref 30.0–36.0)
MCV: 84.9 fL (ref 80.0–100.0)
Platelets: 186 10*3/uL (ref 150–400)
RBC: 5.5 MIL/uL (ref 4.22–5.81)
RDW: 12.4 % (ref 11.5–15.5)
WBC: 7.8 10*3/uL (ref 4.0–10.5)
nRBC: 0 % (ref 0.0–0.2)

## 2023-08-02 LAB — MAGNESIUM: Magnesium: 2.2 mg/dL (ref 1.7–2.4)

## 2023-08-02 LAB — HIV ANTIBODY (ROUTINE TESTING W REFLEX): HIV Screen 4th Generation wRfx: NONREACTIVE

## 2023-08-02 LAB — TSH: TSH: 1.215 u[IU]/mL (ref 0.350–4.500)

## 2023-08-02 MED ORDER — ONDANSETRON HCL 4 MG/2ML IJ SOLN: 4.0000 mg | Freq: Four times a day (QID) | INTRAMUSCULAR | Status: DC | PRN

## 2023-08-02 MED ORDER — BUSPIRONE HCL 10 MG PO TABS
5.0000 mg | ORAL_TABLET | Freq: Two times a day (BID) | ORAL | Status: DC
  Administered 2023-08-02 – 2023-08-03 (×2): 5 mg via ORAL
  Filled 2023-08-02 (×2): qty 1

## 2023-08-02 MED ORDER — LORAZEPAM 2 MG/ML IJ SOLN: 1.0000 mg | Freq: Once | INTRAMUSCULAR | Status: DC | PRN

## 2023-08-02 MED ORDER — ACETAMINOPHEN 650 MG RE SUPP: 650.0000 mg | Freq: Four times a day (QID) | RECTAL | Status: DC | PRN

## 2023-08-02 MED ORDER — HYDROXYZINE HCL 10 MG/5ML PO SYRP
10.0000 mg | ORAL_SOLUTION | Freq: Three times a day (TID) | ORAL | Status: DC | PRN
  Administered 2023-08-02 (×2): 10 mg via ORAL
  Filled 2023-08-02 (×2): qty 5

## 2023-08-02 MED ORDER — MIRTAZAPINE 15 MG PO TABS
7.5000 mg | ORAL_TABLET | Freq: Every day | ORAL | Status: DC
  Administered 2023-08-02: 7.5 mg via ORAL
  Filled 2023-08-02: qty 1

## 2023-08-02 MED ORDER — ACETAMINOPHEN 325 MG PO TABS: 650.0000 mg | ORAL_TABLET | Freq: Four times a day (QID) | ORAL | Status: DC | PRN

## 2023-08-02 MED ORDER — HALOPERIDOL LACTATE 2 MG/ML PO CONC
5.0000 mg | Freq: Every day | ORAL | Status: DC
  Administered 2023-08-02: 5 mg via ORAL
  Filled 2023-08-02 (×2): qty 5

## 2023-08-02 MED ORDER — BISACODYL 5 MG PO TBEC: 5.0000 mg | DELAYED_RELEASE_TABLET | Freq: Every day | ORAL | Status: DC | PRN

## 2023-08-02 MED ORDER — ONDANSETRON HCL 4 MG PO TABS: 4.0000 mg | ORAL_TABLET | Freq: Four times a day (QID) | ORAL | Status: DC | PRN

## 2023-08-02 NOTE — Procedures (Signed)
 Patient Name: Derek Hoffman  MRN: [id number]  Epilepsy Attending: Charlsie Quest  Referring Physician/Provider: Rejeana Brock, MD  Duration: 08/02/2023 4098 to 08/03/2023 1191  Patient history: 33 y.o. male with hx of depression, ADHD, TBI who presents with seizure-like activity.  He has had multiple episodes over the past 24 hours, with the first occurring last week.  He states that he feels like he is "falling back" and then sees a bright light, and then is unable to respond.  He thinks that he sometimes does lose consciousness.  He has never lost his continence.  He is usually back to normal fairly quickly.  There apparently has been some twitching associated with these episodes. EEG to evaluate for seizure  Level of alertness: Awake, asleep  AEDs during EEG study: None  Technical aspects: This EEG study was done with scalp electrodes positioned according to the 10-20 International system of electrode placement. Electrical activity was reviewed with band pass filter of 1-70Hz , sensitivity of 7 uV/mm, display speed of 44mm/sec with a 60Hz  notched filter applied as appropriate. EEG data were recorded continuously and digitally stored.  Video monitoring was available and reviewed as appropriate.  Description: The posterior dominant rhythm consists of 9 Hz activity of moderate voltage (25-35 uV) seen predominantly in posterior head regions, symmetric and reactive to eye opening and eye closing. Sleep was characterized by vertex waves, sleep spindles (12 to 14 Hz), maximal frontocentral region. Hyperventilation and photic stimulation were not performed.     IMPRESSION: This study is within normal limits. No seizures or epileptiform discharges were seen throughout the recording.  A normal interictal EEG does not exclude the diagnosis of epilepsy.  Derek Hoffman Annabelle Harman

## 2023-08-02 NOTE — ED Notes (Signed)
 Pt called out regarding chest pain that sits at a level seven out of ten. Stating the pain radiates down his arm. Pt also endorses pain to the middle of his face that goes straights to the back of his head. Primary RN notified.
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
## 2023-08-02 NOTE — Plan of Care (Signed)
 Patient seen early a.m. by my colleague Dr. Luisa Hart.  Please see recommendations.  EEG unremarkable thus far.  Continue 1 more day of LTM to see if any spells captured for spell characterization. Neurology will follow the results with you  -- Milon Dikes, MD Neurologist [facility]

## 2023-08-02 NOTE — Progress Notes (Signed)
 Unable to get [facility] to monitor patient. Patient and RN is notified. RN mentioned she will keep door open and keep an eye on him

## 2023-08-02 NOTE — ED Notes (Signed)
 Pt told this RN  he is experiencing psychosis and that he is hearing and seeing things. This RN asked pt if he has ever had psychosis, patient stated no. Pt could not explain what he was hearing and seeing. Offered patient drink and sandwich and notified MD.

## 2023-08-02 NOTE — ED Notes (Incomplete)
 Pt stating that he is experiencing psychosis and is hearing and seeing things. This RN asked patient what
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
## 2023-08-02 NOTE — Progress Notes (Signed)
 LTM EEG hooked up and running - no initial skin breakdown - push button tested - [facility] monitoring.

## 2023-08-02 NOTE — Telephone Encounter (Signed)
 [facility] his psych PA [address] [place], [place] PA-C [phone]

## 2023-08-02 NOTE — ED Notes (Signed)
 Hospital bed ordered.

## 2023-08-02 NOTE — Consult Note (Signed)
 NEUROLOGY CONSULT NOTE   Date of service: August 02, 2023 Patient Name: Derek Hoffman MRN:  [id number] DOB:  04/01/1991 Chief Complaint: "Episodes concerning for seizures" Requesting Provider: Buena Irish, MD  History of Present Illness  Derek Hoffman is a 33 y.o. male with hx of depression, ADHD, TBI who presents with seizure-like activity.  He has had multiple episodes over the past 24 hours, with the first occurring last week.  He states that he feels like he is "falling back" and then sees a bright light, and then is unable to respond.  He thinks that he sometimes does lose consciousness.  He has never lost his continence.  He is usually back to normal fairly quickly.  There apparently has been some twitching associated with these episodes.  Of note, he was diagnosed with serotonin syndrome recently and stopped multiple medications including Abilify, Prozac, citalopram, trazodone.  He denies any previous history of staring spells, seizures, or episodes of loss time. 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   Depression    History of MRSA infection 2015   left arm   Lateral meniscus tear 12/2014   left   Serotonin syndrome     Past Surgical History:  Procedure Laterality Date   CHONDROPLASTY Left 01/11/2015   Procedure: CHONDROPLASTY;  Surgeon: [name], MD;  Location: [facility];  Service: Orthopedics;  Laterality: Left;   FEMUR HARDWARE REMOVAL Left 10/28/2007   KNEE ARTHROSCOPY Left 10/28/2007   with manipulation   KNEE ARTHROSCOPY WITH EXCISION PLICA Left 01/11/2015   Procedure: KNEE ARTHROSCOPY WITH EXCISION PLICA;  Surgeon: [name], MD;  Location: [facility];  Service: Orthopedics;  Laterality: Left;   KNEE ARTHROSCOPY WITH LATERAL MENISECTOMY Left 01/11/2015   Procedure: LEFT KNEE ARTHROSCOPY  WITH LATERAL MENISCECTOMY, EXCISION OF MEDIAL PLICA, CHONDROPLASTY;   Surgeon: [name], MD;  Location: [facility];  Service: Orthopedics;  Laterality: Left;   NASAL SEPTOPLASTY W/ TURBINOPLASTY Bilateral 10/08/2020   Procedure: REVISION NASAL SEPTOPLASTY WITH TURBINATE REDUCTION AND SEPTUM REVISION;  Surgeon: Newman Pies, MD;  Location: [facility];  Service: ENT;  Laterality: Bilateral;   ORIF FEMUR FRACTURE Left    SEPTOPLASTY     SHOULDER ARTHROSCOPY WITH ROTATOR CUFF REPAIR Left 06/27/2021   Procedure: Left Shoulder arthroscopy, debridement, subacromial decompression, distal clavicle resection,;  Surgeon: Francena Hanly, MD;  Location: [facility];  Service: Orthopedics;  Laterality: Left;    TONSILLECTOMY AND ADENOIDECTOMY     WISDOM TOOTH EXTRACTION      Family History: Family History  Problem Relation Age of Onset   Hypertension Father    Migraines Neg Hx     Social History  reports that he quit smoking about 13 years ago. His smoking use included cigarettes. He started smoking about 14 years ago. He has a 0.3 pack-year smoking history. He quit smokeless tobacco use about 4 years ago.  His smokeless tobacco use included snuff. He reports that he does not currently use alcohol. He reports that he does not use drugs.  Allergies  Allergen Reactions   Abilify [Aripiprazole]    Cymbalta [Duloxetine Hcl] Other (See Comments)    Hallucination, suicide ideation.   Prozac [Fluoxetine] Other (See Comments)    Serotonin syndrome    Medications   Current Facility-Administered Medications:    acetaminophen (TYLENOL) tablet 650 mg, 650 mg, Oral, Q6H PRN **OR**  acetaminophen (TYLENOL) suppository 650 mg, 650 mg, Rectal, Q6H PRN, Buena Irish, MD   bisacodyl (DULCOLAX) EC tablet 5 mg, 5 mg, Oral, Daily PRN, Buena Irish, MD   LORazepam (ATIVAN) injection 1 mg, 1 mg, Intravenous, Once PRN, Buena Irish, MD   ondansetron (ZOFRAN) tablet 4 mg, 4 mg, Oral, Q6H PRN **OR** ondansetron (ZOFRAN) injection 4 mg, 4  mg, Intravenous, Q6H PRN, Buena Irish, MD  Current Outpatient Medications:    acetaminophen (TYLENOL) 500 MG tablet, Take 1,000 mg by mouth every 6 (six) hours as needed for mild pain (pain score 1-3) or headache., Disp: , Rfl:    ALPRAZolam (XANAX) 0.25 MG tablet, Take 0.25 mg by mouth daily as needed for anxiety. Take an additional 1/2 tablet as needed, Disp: , Rfl:    Bacillus Coagulans-Inulin (PROBIOTIC-PREBIOTIC PO), Take 1 Dose by mouth daily. 1 dose = 1 gummie, Disp: , Rfl:    busPIRone (BUSPAR) 5 MG tablet, Take 5 mg by mouth 2 (two) times daily., Disp: , Rfl:    hydrOXYzine (ATARAX) 10 MG tablet, Take 10 mg by mouth 3 (three) times daily as needed for anxiety., Disp: , Rfl:    ibuprofen (ADVIL) 200 MG tablet, Take 400 mg by mouth every 6 (six) hours as needed for headache or mild pain (pain score 1-3)., Disp: , Rfl:    propranolol (INDERAL) 10 MG tablet, Take 10 mg by mouth 2 (two) times daily., Disp: , Rfl:    traZODone (DESYREL) 50 MG tablet, Take 50-100 mg by mouth at bedtime as needed for sleep., Disp: , Rfl:    Vilazodone HCl (VIIBRYD) 10 MG TABS, Take 10 mg by mouth daily., Disp: , Rfl:    VRAYLAR 1.5 MG capsule, Take 1.5 mg by mouth daily. (Patient not taking: Reported on 08/02/2023), Disp: , Rfl:   Vitals   Vitals:   08/01/23 2315 08/02/23 0047 08/02/23 0223 08/02/23 0300  BP: 128/85 129/89  (!) 134/90  Pulse: 85 80  70  Resp:  12  (!) 21  Temp:   98.1 F (36.7 C)   TempSrc:   Oral   SpO2: 100% 100%  98%  Weight:        Body mass index is 35.67 kg/m.  Physical Exam   Constitutional: Appears well-developed and well-nourished.  Psych: Affect appropriate to situation.  Eyes: No scleral injection.  HENT: No OP obstruction.  Head: Normocephalic.  Cardiovascular: Normal rate and regular rhythm.  Respiratory: Effort normal, non-labored breathing.  GI: Soft.  No distension. There is no tenderness.  Skin: WDI.   Neurologic Examination    Neuro: Mental  Status: Patient is awake, alert, oriented to person, place, month, year, and situation. Patient is able to give a clear and coherent history. No signs of aphasia or neglect Cranial Nerves: II: Visual Fields are full. Pupils are equal, round, and reactive to light.   III,IV, VI: EOMI without ptosis or diploplia.  V: Facial sensation is symmetric to temperature VII: Facial movement is symmetric.  VIII: hearing is intact to voice X: Uvula elevates symmetrically XII: tongue is midline without atrophy or fasciculations.  Motor: Tone is normal. Bulk is normal. 5/5 strength was present in all four extremities.  Sensory: Sensation is symmetric to light touch and temperature in the arms and legs. Deep Tendon Reflexes: 2+ and symmetric in the biceps and patellae.  Plantars: Toes are downgoing bilaterally.  Cerebellar: FNF and HKS are intact bilaterally        Labs/Imaging/Neurodiagnostic studies   CBC:  Recent Labs  Lab 08/01/23 1525  WBC 7.0  NEUTROABS 5.0  HGB 17.0  HCT 50.4  MCV 85.0  PLT 203   Basic Metabolic Panel:  Lab Results  Component Value Date   NA 138 08/01/2023   K 3.4 (L) 08/01/2023   CO2 24 08/01/2023   GLUCOSE 131 (H) 08/01/2023   BUN 6 08/01/2023   CREATININE 0.92 08/01/2023   CALCIUM 9.6 08/01/2023   GFRNONAA >60 08/01/2023   GFRAA >60 08/28/2016   Lipid Panel:  Lab Results  Component Value Date   LDLCALC 118 (H) 08/21/2021   HgbA1c:  Lab Results  Component Value Date   HGBA1C 5.2 08/21/2021   Urine Drug Screen:     Component Value Date/Time   LABOPIA NONE DETECTED 08/01/2023 1525   COCAINSCRNUR NONE DETECTED 08/01/2023 1525   LABBENZ POSITIVE (A) 08/01/2023 1525   AMPHETMU NONE DETECTED 08/01/2023 1525   THCU NONE DETECTED 08/01/2023 1525   LABBARB NONE DETECTED 08/01/2023 1525    Alcohol Level     Component Value Date/Time   ETH <10 08/01/2023 1526   INR  Lab Results  Component Value Date   INR 1.0 12/20/2020   APTT No  results found for: "APTT" AED levels: No results found for: "PHENYTOIN", "ZONISAMIDE", "LAMOTRIGINE", "LEVETIRACETA"   MRI Brain(Personally reviewed): Negative  ASSESSMENT   Ociel Retherford is a 33 y.o. male with recurrent episodes of unclear etiology.  With his psychiatric history, I do wonder about a possible stress related phenomenon (conversion disorder).  This is a diagnosis of exclusion, and I would favor getting continuous EEG to assess for possible seizure, especially given his history of concussion.  Given the frequency with which they are happening, we should be able to capture one and therefore I would favor admission for continuous EEG.  RECOMMENDATIONS  Continuous EEG for spell characterization Neurology will follow ______________________________________________________________________    Signed, Ritta Slot, MD [facility]

## 2023-08-02 NOTE — ED Notes (Signed)
 Pt reporting some chest discomfort and anxiety. New EKG obtained and reviewed by EDP with concerns. RN notified admitting team and has requested PRN Atarax from pharmacy.

## 2023-08-02 NOTE — Telephone Encounter (Signed)
 Tresa Endo, can you find a spot for this patient before march 10th:? I believe he is having extreme anxiety and PNES but his anxiety over what is going on is making it worse I don;t suppose we can squeeze him in sooner?   If not, Pod 4 can we contact the 72 hour ambulatoy EEG group and see if they can do a routine and then a 3-day sooner if his insurance will pay?  CCing Dr. Teresa Coombs, he saw the video of patient (taken and shared with patient's permission) so he is aware of the situation thank you
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
## 2023-08-02 NOTE — ED Notes (Signed)
 Pt resting comfortably at this time, equal chest rise and fall noted.

## 2023-08-02 NOTE — Progress Notes (Signed)
 LTM maint complete - no skin breakdown under:  fp1,fp2,a1

## 2023-08-03 ENCOUNTER — Observation Stay (HOSPITAL_COMMUNITY): Payer: 59

## 2023-08-03 DIAGNOSIS — R569 Unspecified convulsions: Secondary | ICD-10-CM | POA: Diagnosis not present

## 2023-08-03 MED ORDER — ACETAMINOPHEN 325 MG PO TABS: 650.0000 mg | ORAL_TABLET | Freq: Four times a day (QID) | ORAL | Status: AC | PRN

## 2023-08-03 MED ORDER — MIRTAZAPINE 15 MG PO TABS: 15.0000 mg | ORAL_TABLET | Freq: Every day | ORAL | Status: DC

## 2023-08-03 MED ORDER — MIRTAZAPINE 15 MG PO TABS: 15.0000 mg | ORAL_TABLET | Freq: Every day | ORAL | 1 refills | Status: DC
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
## 2023-08-03 NOTE — Telephone Encounter (Signed)
 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 Patient should see psychiatry. The pt's mother understands and is amenable but she also wanted to be sure that patient is treated appropriately with the right medication and not "drugged" with medications that could cause more harm than good. If this is anxiety driven she wants to be sure he's on the right medication to treat the anxiety. She has concerns with his recent history of serotonin syndrome and states that his psychiatrist had ordered buspirone and viibryd together. She doesn't want patient to go down the same paths as before. Pt's mother states he has been to the hospital several times and has checked himself into a mental facility twice. She also wants to be sure that the right testing is done and is asking if this could be caused by his neck surgery from December (pt had radiofrequency ablation). I advised Dr Lucia Gaskins is aware of the serotonin syndrome and the surgery. We have ruled out neurologic/epileptic cause via the tests. I told her this message would be forwarded to Dr Lucia Gaskins. She will also be talking to pt's psychiatrist. Pt's mother was appreciative.

## 2023-08-03 NOTE — Progress Notes (Addendum)
 NEUROLOGY CONSULT FOLLOW UP NOTE   Date of service: August 03, 2023 Patient Name: Derek Hoffman MRN:  [id number] DOB:  1990-06-20   Interval Hx/subjective  Reports he had two episodes without loss of consciousness yesterday, typical events for him  He reports significant difficulty sleeping, stating he has not slept in three days and describes his mind as 'racing'. He attributes some sleep disturbances to PTSD. He consumes two to three colas a day (Dr. Reino Kent), typically in the morning and at lunchtime, and engages in regular exercise.  Vitals   Vitals:   08/02/23 2113 08/02/23 2336 08/03/23 0454 08/03/23 0812  BP: (!) 123/92 109/80 127/78 121/75  Pulse: 92 76 62 70  Resp: 16 16 16 18   Temp: 97.9 F (36.6 C) 97.8 F (36.6 C) (!) 97.5 F (36.4 C) 97.6 F (36.4 C)  TempSrc: Oral Oral Oral Oral  SpO2: 97% 96% 100% 100%  Weight:         Body mass index is 35.67 kg/m.  Physical Exam   Constitutional: Appears well-developed and well-nourished.  Psych: Affect appropriate to situation.  Eyes: No scleral injection.  HENT: No OP obstruction, EEG in place  Head: Normocephalic.  Cardiovascular: Normal rate and regular rhythm.  Respiratory: Effort normal, non-labored breathing.  GI: Soft.  No distension. There is no tenderness.  Skin: WDI.   Neurologic Examination   Mental status: Alert, awake, follows simple commands, fluent casual speech Cranial nerves: EOMI, face symmetric, tongue midline, visual fields full to confrontation Motor: 5/5 in proximal muscle groups throughout.  No pronator drift Coordination: Intact finger tapping bilaterally Sensation: Intact to light touch throughout   Medications  Current Facility-Administered Medications:    acetaminophen (TYLENOL) tablet 650 mg, 650 mg, Oral, Q6H PRN **OR** acetaminophen (TYLENOL) suppository 650 mg, 650 mg, Rectal, Q6H PRN, Buena Irish, MD   bisacodyl (DULCOLAX) EC tablet 5 mg, 5 mg, Oral, Daily PRN,  Buena Irish, MD   busPIRone (BUSPAR) tablet 5 mg, 5 mg, Oral, BID, Akintayo, Musa A, MD, 5 mg at 08/03/23 1005   haloperidol (HALDOL) 2 MG/ML solution 5 mg, 5 mg, Oral, QHS, Akintayo, Musa A, MD, 5 mg at 08/02/23 2128   hydrOXYzine (ATARAX) 10 MG/5ML syrup 10 mg, 10 mg, Oral, TID PRN, Meredeth Ide, MD, 10 mg at 08/02/23 1955   LORazepam (ATIVAN) injection 1 mg, 1 mg, Intravenous, Once PRN, Buena Irish, MD   mirtazapine (REMERON) tablet 7.5 mg, 7.5 mg, Oral, QHS, Akintayo, Musa A, MD, 7.5 mg at 08/02/23 2127   ondansetron (ZOFRAN) tablet 4 mg, 4 mg, Oral, Q6H PRN **OR** ondansetron (ZOFRAN) injection 4 mg, 4 mg, Intravenous, Q6H PRN, Buena Irish, MD  Labs and Diagnostic Imaging   CBC:  Recent Labs  Lab 08/01/23 1525 08/02/23 0514  WBC 7.0 7.8  NEUTROABS 5.0  --   HGB 17.0 16.1  HCT 50.4 46.7  MCV 85.0 84.9  PLT 203 186    Basic Metabolic Panel:  Lab Results  Component Value Date   NA 139 08/02/2023   K 3.8 08/02/2023   CO2 26 08/02/2023   GLUCOSE 94 08/02/2023   BUN 7 08/02/2023   CREATININE 1.11 08/02/2023   CALCIUM 9.4 08/02/2023   GFRNONAA >60 08/02/2023   GFRAA >60 08/28/2016   Lipid Panel:  Lab Results  Component Value Date   LDLCALC 118 (H) 08/21/2021   HgbA1c:  Lab Results  Component Value Date   HGBA1C 5.2 08/21/2021   Urine Drug Screen:     Component  Value Date/Time   LABOPIA NONE DETECTED 08/01/2023 1525   COCAINSCRNUR NONE DETECTED 08/01/2023 1525   LABBENZ POSITIVE (A) 08/01/2023 1525   AMPHETMU NONE DETECTED 08/01/2023 1525   THCU NONE DETECTED 08/01/2023 1525   LABBARB NONE DETECTED 08/01/2023 1525    Alcohol Level     Component Value Date/Time   ETH <10 08/01/2023 1526   INR  Lab Results  Component Value Date   INR 1.0 12/20/2020    cEEG: 08/03/2023 0437 to 08/04/2023 3557  Description: The posterior dominant rhythm consists of 9 Hz activity of moderate voltage (25-35 uV) seen predominantly in posterior head  regions, symmetric and reactive to eye opening and eye closing. Sleep was characterized by vertex waves, sleep spindles (12 to 14 Hz), maximal frontocentral region. Hyperventilation and photic stimulation were not performed.      IMPRESSION: This study is within normal limits. No seizures or epileptiform discharges were seen throughout the recording.   A normal interictal EEG does not exclude the diagnosis of epilepsy.  Assessment   Derek Hoffman is a 33 y.o. 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 EEG monitoring did not show any epileptic activity despite two spells captured (did not press button to specifically identify events) confirming the diagnosis of dissociative seizures. TBI, PTSD, ADHD, and depression may contribute to these episodes. Educated on the non-epileptic nature of dissociative seizures, often related to stress or psychological factors. Therapy and psychiatric support are the mainstays of treatment. Antiseizure medications are not indicated and may worsen symptoms. Discussed that up to 50% of patients referred to epilepsy clinics may have dissociative seizures. Advised against driving until episodes are under control due to the risk of accidents. - Discontinue EEG monitoring - Provided educational handout on dissociative seizures from neurosymptoms.org and reviewed handout with patient in detail at bedside - Recommend therapy and psychiatric support - Advise against driving until episodes are under control - Refer to neurosymptoms.org and nonepilepticattacks.info for further information  Insomnia Reports significant difficulty sleeping, with episodes of being awake for three to four days at a time (though he did not realize he actually did sleep some overnight,  captured on EEG and reviewed with patient). Likely exacerbated by PTSD and recent medication changes due to serotonin syndrome. Educated on sleep hygiene practices and the potential benefits of regular exercise, avoiding caffeine, and using relaxation techniques such as meditation. Advised against long-term use of sleep aids due to potential side effects and dependency. Discussed the importance of lifestyle changes and the potential benefit of a sleep study. - Educate on sleep hygiene practices - Recommend regular exercise, avoiding caffeine, and using relaxation techniques - Consider referral to a sleep specialist if symptoms persist ______________________________________________________________________   Signed, Gordy Councilman, MD [facility] Greater than 35 minutes spent in care of this patient, majority of the time at bedside  Abridge software was used to assist in generation of this note with consent from the patient

## 2023-08-03 NOTE — Plan of Care (Signed)

## 2023-08-03 NOTE — Progress Notes (Signed)
 LTM maint complete - no skin breakdown under:  A1, F7.

## 2023-08-03 NOTE — Addendum Note (Signed)
 Addended by: Bertram Savin on: 08/03/2023 11:52 AM   Modules accepted: Orders

## 2023-08-03 NOTE — Progress Notes (Signed)
EEG LTM D/C'd. No skin break down noted. [facility] notified.  

## 2023-08-03 NOTE — Telephone Encounter (Addendum)
 Pt's Derek Hoffman notifying has been admitted to [facility] and they are running a lot of test. Want to make sure the test she wanted done are being done while in the hospital. Would like a call back  to keep Dr. Lucia Gaskins in the loop.

## 2023-08-03 NOTE — Telephone Encounter (Signed)
 I notified Dr Lucia Gaskins. She sees report that MRI brain normal and overnight EEG was normal. Per Dr Lucia Gaskins, these are non-epileptic events. She advises patient to follow-up with psychiatry. We can discontinue the EEG and brain MRI we had ordered here.

## 2023-08-03 NOTE — Telephone Encounter (Signed)
 Agree with psych follow up for management of non epileptic spells.

## 2023-08-03 NOTE — Plan of Care (Signed)

## 2023-08-03 NOTE — Procedures (Addendum)
 Patient Name: Derek Hoffman  MRN: [id number]  Epilepsy Attending: Charlsie Quest  Referring Physician/Provider: Rejeana Brock, MD  Duration: 08/03/2023 0437 to 08/04/2023 1047   Patient history: 32 y.o. male with hx of depression, ADHD, TBI who presents with seizure-like activity.  He has had multiple episodes over the past 24 hours, with the first occurring last week.  He states that he feels like he is "falling back" and then sees a bright light, and then is unable to respond.  He thinks that he sometimes does lose consciousness.  He has never lost his continence.  He is usually back to normal fairly quickly.  There apparently has been some twitching associated with these episodes. EEG to evaluate for seizure   Level of alertness: Awake, asleep   AEDs during EEG study: None   Technical aspects: This EEG study was done with scalp electrodes positioned according to the 10-20 International system of electrode placement. Electrical activity was reviewed with band pass filter of 1-70Hz , sensitivity of 7 uV/mm, display speed of 64mm/sec with a 60Hz  notched filter applied as appropriate. EEG data were recorded continuously and digitally stored.  Video monitoring was available and reviewed as appropriate.   Description: The posterior dominant rhythm consists of 9 Hz activity of moderate voltage (25-35 uV) seen predominantly in posterior head regions, symmetric and reactive to eye opening and eye closing. Sleep was characterized by vertex waves, sleep spindles (12 to 14 Hz), maximal frontocentral region. Hyperventilation and photic stimulation were not performed.      IMPRESSION: This study is within normal limits. No seizures or epileptiform discharges were seen throughout the recording.   A normal interictal EEG does not exclude the diagnosis of epilepsy.   Derek Hoffman

## 2023-08-06 DIAGNOSIS — F445 Conversion disorder with seizures or convulsions: Secondary | ICD-10-CM | POA: Diagnosis not present

## 2023-08-06 DIAGNOSIS — F332 Major depressive disorder, recurrent severe without psychotic features: Secondary | ICD-10-CM | POA: Diagnosis not present

## 2023-08-06 DIAGNOSIS — F411 Generalized anxiety disorder: Secondary | ICD-10-CM | POA: Diagnosis not present

## 2023-08-06 DIAGNOSIS — F410 Panic disorder [episodic paroxysmal anxiety] without agoraphobia: Secondary | ICD-10-CM | POA: Diagnosis not present

## 2023-08-06 NOTE — Telephone Encounter (Signed)
 See other phone note this is a duplicate fyi  Let mother know I have tried to reach psychiatry just to let them know what is going on(I don't have any advice on medication) but I can't reach her.  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 I'm sorry I can't manage any of this, has to be psychiatry and therapy. I hope her feels better.

## 2023-08-06 NOTE — Telephone Encounter (Signed)
 I spoke with the patient's mother Derek Hoffman and relayed the message from Dr. Lucia Gaskins.  She took notes to discuss with psychiatry. She is going to be speaking with them today and the pt has an appt on Monday. She will let them know that Dr Lucia Gaskins hasn't been able to reach them. She understands Dr Lidia Collum cannot manage his condition as this needs to be psychiatry and therapy. She is aware patient has been diagnosed with nonepileptic pseudoseizures confirmed with MRI and normal EEG. No need for further testing here. For now the patient wishes to keep his f/u with [place] but his mother understands the plan of care needs to be established with psychiatry and therapy. She thanked me for the call.

## 2023-08-06 NOTE — Telephone Encounter (Signed)
 Let mother know I have tried to reach psychiatry just to let them know what is going on(I don't have any advice on medication) but I can't reach her.  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 I'm sorry I can't manage any of this, has to be psychiatry and therapy. I hope her feels better.

## 2023-08-06 NOTE — Telephone Encounter (Signed)
 duplicate

## 2023-08-10 DIAGNOSIS — F332 Major depressive disorder, recurrent severe without psychotic features: Secondary | ICD-10-CM | POA: Diagnosis not present

## 2023-08-10 DIAGNOSIS — F445 Conversion disorder with seizures or convulsions: Secondary | ICD-10-CM | POA: Diagnosis not present

## 2023-08-10 DIAGNOSIS — F411 Generalized anxiety disorder: Secondary | ICD-10-CM | POA: Diagnosis not present

## 2023-08-10 DIAGNOSIS — F410 Panic disorder [episodic paroxysmal anxiety] without agoraphobia: Secondary | ICD-10-CM | POA: Diagnosis not present

## 2023-08-13 DIAGNOSIS — Z5329 Procedure and treatment not carried out because of patient's decision for other reasons: Secondary | ICD-10-CM | POA: Diagnosis not present

## 2023-08-13 DIAGNOSIS — R410 Disorientation, unspecified: Secondary | ICD-10-CM | POA: Diagnosis not present

## 2023-08-13 DIAGNOSIS — R5383 Other fatigue: Secondary | ICD-10-CM | POA: Diagnosis not present

## 2023-08-13 DIAGNOSIS — F99 Mental disorder, not otherwise specified: Secondary | ICD-10-CM | POA: Diagnosis not present

## 2023-08-13 DIAGNOSIS — R413 Other amnesia: Secondary | ICD-10-CM | POA: Diagnosis not present

## 2023-08-17 DIAGNOSIS — F0632 Mood disorder due to known physiological condition with major depressive-like episode: Secondary | ICD-10-CM | POA: Diagnosis not present

## 2023-08-17 DIAGNOSIS — F411 Generalized anxiety disorder: Secondary | ICD-10-CM | POA: Diagnosis not present

## 2023-08-17 DIAGNOSIS — F410 Panic disorder [episodic paroxysmal anxiety] without agoraphobia: Secondary | ICD-10-CM | POA: Diagnosis not present

## 2023-08-17 DIAGNOSIS — F445 Conversion disorder with seizures or convulsions: Secondary | ICD-10-CM | POA: Diagnosis not present

## 2023-08-17 DIAGNOSIS — F332 Major depressive disorder, recurrent severe without psychotic features: Secondary | ICD-10-CM | POA: Diagnosis not present

## 2023-08-18 DIAGNOSIS — F339 Major depressive disorder, recurrent, unspecified: Secondary | ICD-10-CM | POA: Diagnosis not present

## 2023-08-18 DIAGNOSIS — F411 Generalized anxiety disorder: Secondary | ICD-10-CM | POA: Diagnosis not present

## 2023-08-20 DIAGNOSIS — R0789 Other chest pain: Secondary | ICD-10-CM | POA: Diagnosis not present

## 2023-08-20 DIAGNOSIS — K219 Gastro-esophageal reflux disease without esophagitis: Secondary | ICD-10-CM | POA: Diagnosis not present

## 2023-08-20 DIAGNOSIS — F419 Anxiety disorder, unspecified: Secondary | ICD-10-CM | POA: Diagnosis not present

## 2023-08-21 DIAGNOSIS — F410 Panic disorder [episodic paroxysmal anxiety] without agoraphobia: Secondary | ICD-10-CM | POA: Diagnosis not present

## 2023-08-21 DIAGNOSIS — F445 Conversion disorder with seizures or convulsions: Secondary | ICD-10-CM | POA: Diagnosis not present

## 2023-08-21 DIAGNOSIS — F332 Major depressive disorder, recurrent severe without psychotic features: Secondary | ICD-10-CM | POA: Diagnosis not present

## 2023-08-21 DIAGNOSIS — F411 Generalized anxiety disorder: Secondary | ICD-10-CM | POA: Diagnosis not present

## 2023-08-24 DIAGNOSIS — F411 Generalized anxiety disorder: Secondary | ICD-10-CM | POA: Diagnosis not present

## 2023-08-24 DIAGNOSIS — F0632 Mood disorder due to known physiological condition with major depressive-like episode: Secondary | ICD-10-CM | POA: Diagnosis not present

## 2023-08-25 DIAGNOSIS — F0632 Mood disorder due to known physiological condition with major depressive-like episode: Secondary | ICD-10-CM | POA: Diagnosis not present

## 2023-08-25 DIAGNOSIS — F411 Generalized anxiety disorder: Secondary | ICD-10-CM | POA: Diagnosis not present

## 2023-08-26 DIAGNOSIS — F410 Panic disorder [episodic paroxysmal anxiety] without agoraphobia: Secondary | ICD-10-CM | POA: Diagnosis not present

## 2023-08-26 DIAGNOSIS — F332 Major depressive disorder, recurrent severe without psychotic features: Secondary | ICD-10-CM | POA: Diagnosis not present

## 2023-08-26 DIAGNOSIS — F445 Conversion disorder with seizures or convulsions: Secondary | ICD-10-CM | POA: Diagnosis not present

## 2023-08-26 DIAGNOSIS — F411 Generalized anxiety disorder: Secondary | ICD-10-CM | POA: Diagnosis not present

## 2023-08-27 ENCOUNTER — Telehealth: Payer: Self-pay | Admitting: Neurology

## 2023-08-27 DIAGNOSIS — F411 Generalized anxiety disorder: Secondary | ICD-10-CM | POA: Diagnosis not present

## 2023-08-27 DIAGNOSIS — F332 Major depressive disorder, recurrent severe without psychotic features: Secondary | ICD-10-CM | POA: Diagnosis not present

## 2023-08-27 NOTE — Telephone Encounter (Signed)
 Patient's mother, Mancil Pfenning (on last DPR) would like a call back to discuss MRI and EEG.

## 2023-08-31 DIAGNOSIS — F411 Generalized anxiety disorder: Secondary | ICD-10-CM | POA: Diagnosis not present

## 2023-08-31 DIAGNOSIS — F0632 Mood disorder due to known physiological condition with major depressive-like episode: Secondary | ICD-10-CM | POA: Diagnosis not present

## 2023-08-31 NOTE — Telephone Encounter (Signed)
 I truly think this is psychiatric I would defer to psychiatry and primary care. I am so sorry I do not believe he has a neurodegenerative brain disease so no further testing. thanks

## 2023-08-31 NOTE — Telephone Encounter (Signed)
 I called the patient's mother back.  She states the patient has seen psychiatry and she feels like they are just continuing to mask with medication.  She said the patient has not been having any more seizures but he has continued to report trouble with his memory and it is "scaring him".  They went to a place in [place] which was a sort of inpatient residential place but they did not feel it was helpful so they left.  She states the facility was questioning if his memory trouble is related to something physical instead of mental.  The patient's mother is asking if this is something that should be explored and if there are further tests that can be done to determine if this is physical vs mental. I told her I would send a message over to Dr. Lucia Gaskins at this time. The mother is asking to schedule an appointment with Dr Lucia Gaskins.

## 2023-08-31 NOTE — Telephone Encounter (Signed)
 Spoke with pt's mother and relayed the message from Dr Lucia Gaskins to her. She verbalized understanding and thanked me for the call. She will relay this to the patient and felt the answer was helpful. She said they are in the process of finding another psychiatrist.

## 2023-09-01 DIAGNOSIS — F411 Generalized anxiety disorder: Secondary | ICD-10-CM | POA: Diagnosis not present

## 2023-09-01 DIAGNOSIS — F339 Major depressive disorder, recurrent, unspecified: Secondary | ICD-10-CM | POA: Diagnosis not present

## 2023-09-03 DIAGNOSIS — F411 Generalized anxiety disorder: Secondary | ICD-10-CM | POA: Diagnosis not present

## 2023-09-03 DIAGNOSIS — F0632 Mood disorder due to known physiological condition with major depressive-like episode: Secondary | ICD-10-CM | POA: Diagnosis not present

## 2023-09-05 ENCOUNTER — Emergency Department (HOSPITAL_COMMUNITY)
Admission: EM | Admit: 2023-09-05 | Discharge: 2023-09-05 | Disposition: A | Discharge status: Home / Self Care | Attending: Emergency Medicine | Admitting: Emergency Medicine

## 2023-09-05 ENCOUNTER — Emergency Department (HOSPITAL_COMMUNITY)

## 2023-09-05 DIAGNOSIS — R109 Unspecified abdominal pain: Secondary | ICD-10-CM | POA: Diagnosis not present

## 2023-09-05 DIAGNOSIS — F419 Anxiety disorder, unspecified: Secondary | ICD-10-CM | POA: Diagnosis not present

## 2023-09-05 DIAGNOSIS — K523 Indeterminate colitis: Secondary | ICD-10-CM | POA: Insufficient documentation

## 2023-09-05 DIAGNOSIS — K253 Acute gastric ulcer without hemorrhage or perforation: Secondary | ICD-10-CM

## 2023-09-05 DIAGNOSIS — K279 Peptic ulcer, site unspecified, unspecified as acute or chronic, without hemorrhage or perforation: Secondary | ICD-10-CM | POA: Diagnosis not present

## 2023-09-05 DIAGNOSIS — R1084 Generalized abdominal pain: Secondary | ICD-10-CM | POA: Diagnosis not present

## 2023-09-05 LAB — CBC WITH DIFFERENTIAL/PLATELET
Abs Immature Granulocytes: 0.02 10*3/uL (ref 0.00–0.07)
Basophils Absolute: 0 10*3/uL (ref 0.0–0.1)
Basophils Relative: 0 %
Eosinophils Absolute: 0 10*3/uL (ref 0.0–0.5)
Eosinophils Relative: 0 %
HCT: 48.8 % (ref 39.0–52.0)
Hemoglobin: 16.2 g/dL (ref 13.0–17.0)
Immature Granulocytes: 0 %
Lymphocytes Relative: 23 %
Lymphs Abs: 1.7 10*3/uL (ref 0.7–4.0)
MCH: 29 pg (ref 26.0–34.0)
MCHC: 33.2 g/dL (ref 30.0–36.0)
MCV: 87.3 fL (ref 80.0–100.0)
Monocytes Absolute: 0.7 10*3/uL (ref 0.1–1.0)
Monocytes Relative: 10 %
Neutro Abs: 4.9 10*3/uL (ref 1.7–7.7)
Neutrophils Relative %: 67 %
Platelets: 195 10*3/uL (ref 150–400)
RBC: 5.59 MIL/uL (ref 4.22–5.81)
RDW: 12.8 % (ref 11.5–15.5)
WBC: 7.4 10*3/uL (ref 4.0–10.5)
nRBC: 0 % (ref 0.0–0.2)

## 2023-09-05 LAB — RAPID URINE DRUG SCREEN, HOSP PERFORMED
Amphetamines: NOT DETECTED
Barbiturates: NOT DETECTED
Benzodiazepines: POSITIVE — AB
Cocaine: NOT DETECTED
Opiates: NOT DETECTED
Tetrahydrocannabinol: NOT DETECTED

## 2023-09-05 LAB — URINALYSIS, ROUTINE W REFLEX MICROSCOPIC
Bilirubin Urine: NEGATIVE
Glucose, UA: NEGATIVE mg/dL
Hgb urine dipstick: NEGATIVE
Ketones, ur: NEGATIVE mg/dL
Leukocytes,Ua: NEGATIVE
Nitrite: NEGATIVE
Protein, ur: NEGATIVE mg/dL
Specific Gravity, Urine: 1.02 (ref 1.005–1.030)
pH: 5 (ref 5.0–8.0)

## 2023-09-05 LAB — COMPREHENSIVE METABOLIC PANEL WITH GFR
ALT: 46 U/L — ABNORMAL HIGH (ref 0–44)
AST: 24 U/L (ref 15–41)
Albumin: 4.4 g/dL (ref 3.5–5.0)
Alkaline Phosphatase: 59 U/L (ref 38–126)
Anion gap: 8 (ref 5–15)
BUN: 7 mg/dL (ref 6–20)
CO2: 23 mmol/L (ref 22–32)
Calcium: 9.3 mg/dL (ref 8.9–10.3)
Chloride: 108 mmol/L (ref 98–111)
Creatinine, Ser: 0.91 mg/dL (ref 0.61–1.24)
GFR, Estimated: >60 mL/min (ref >60)
Glucose, Bld: 84 mg/dL (ref 70–99)
Potassium: 3.5 mmol/L (ref 3.5–5.1)
Sodium: 139 mmol/L (ref 135–145)
Total Bilirubin: 0.3 mg/dL (ref 0.0–1.2)
Total Protein: 7.3 g/dL (ref 6.5–8.1)

## 2023-09-05 LAB — LIPASE, BLOOD: Lipase: 31 U/L (ref 11–51)

## 2023-09-05 LAB — ACETAMINOPHEN LEVEL: Acetaminophen (Tylenol), Serum: <10 ug/mL — ABNORMAL LOW (ref 10–30)

## 2023-09-05 LAB — SALICYLATE LEVEL: Salicylate Lvl: <7 mg/dL — ABNORMAL LOW (ref 7.0–30.0)

## 2023-09-05 LAB — ETHANOL: Alcohol, Ethyl (B): <10 mg/dL (ref <10)

## 2023-09-05 MED ORDER — ALPRAZOLAM 0.25 MG PO TABS: 0.2500 mg | ORAL_TABLET | Freq: Three times a day (TID) | ORAL | 0 refills | Status: DC | PRN

## 2023-09-05 MED ORDER — IOHEXOL 300 MG/ML  SOLN
100.0000 mL | Freq: Once | INTRAMUSCULAR | Status: AC | PRN
  Administered 2023-09-05: 100 mL via INTRAVENOUS

## 2023-09-05 MED ORDER — OMEPRAZOLE 20 MG PO CPDR: 20.0000 mg | DELAYED_RELEASE_CAPSULE | Freq: Every day | ORAL | 0 refills | Status: AC

## 2023-09-05 MED ORDER — FAMOTIDINE 20 MG PO TABS
20.0000 mg | ORAL_TABLET | Freq: Once | ORAL | Status: AC
  Administered 2023-09-05: 20 mg via ORAL
  Filled 2023-09-05: qty 1

## 2023-09-05 MED ORDER — ALUM & MAG HYDROXIDE-SIMETH 200-200-20 MG/5ML PO SUSP
30.0000 mL | Freq: Once | ORAL | Status: AC
  Administered 2023-09-05: 30 mL via ORAL
  Filled 2023-09-05: qty 30

## 2023-09-05 NOTE — ED Provider Notes (Signed)
 [place] EMERGENCY DEPARTMENT AT [facility] Provider Note   CSN: [id number] Arrival date & time: 09/05/23  0825     History No chief complaint on file.   Derek Hoffman is a 33 y.o. male. Patient presents to the ED today with concerns of worsening anxiety and generalized abdominal pain. Past history significant for recurrent major depressive disorder, generalized abdominal pain, seizure-like activity.  Patient reportedly has been on several different medications for his anxiety and states that is not well-controlled.  Feels that he is losing some help with getting his anxiety under control.  He reportedly discussed with psychiatrist inability to have sustained relief in symptoms with medications.  Has previously had a combination of SSRIs, SNRIs, benzodiazepines with only minimal improvement in symptoms.  He states that he currently has Xanax prescribed but has been using this and feels that he is getting minimal relief.  Does endorse having some blood per rectum when he wipes but denies any blood in stool, toilet, or melanotic stool.  Denies any alcohol use, tobacco use, or recreational substance use.  HPI     Home Medications Prior to Admission medications   Medication Sig Start Date End Date Taking? Authorizing Provider  ALPRAZolam Prudy Feeler) 0.25 MG tablet Take by mouth. 08/26/23  Yes [provider]  ALPRAZolam (XANAX) 0.25 MG tablet Take 1 tablet (0.25 mg total) by mouth 3 (three) times daily as needed for anxiety. 09/05/23  Yes Maryanna Shape A, PA-C  cloNIDine (CATAPRES) 0.1 MG tablet Take 0.1 mg by mouth 3 (three) times daily as needed. 08/17/23  Yes [provider]  escitalopram (LEXAPRO) 5 MG tablet Take 5 mg by mouth daily. 08/23/23  Yes [provider]  lamoTRIgine (LAMICTAL) 25 MG tablet Take 50 mg by mouth daily. 08/25/23  Yes [provider]  omeprazole (PRILOSEC) 20 MG capsule Take 1 capsule (20 mg total) by mouth daily for 15 days.  09/05/23 09/20/23 Yes Smitty Knudsen, PA-C  acetaminophen (TYLENOL) 325 MG tablet Take 2 tablets (650 mg total) by mouth every 6 (six) hours as needed for mild pain (pain score 1-3) (or Fever >/= 101). 08/03/23   Barnetta Chapel, MD  busPIRone (BUSPAR) 5 MG tablet Take 5 mg by mouth 2 (two) times daily. 07/29/23   [provider]  hydrOXYzine (ATARAX) 10 MG tablet Take 10 mg by mouth 3 (three) times daily as needed for anxiety. 07/03/23   [provider]  mirtazapine (REMERON) 15 MG tablet Take 1 tablet (15 mg total) by mouth at bedtime. 08/03/23   Barnetta Chapel, MD      Allergies    Abilify [aripiprazole], Cymbalta [duloxetine hcl], and Prozac [fluoxetine]    Review of Systems   Review of Systems  Psychiatric/Behavioral:  The patient is nervous/anxious.   All other systems reviewed and are negative.   Physical Exam Updated Vital Signs BP (!) 145/96 (BP Location: Right Arm)   Pulse (!) 16   Temp 97.6 F (36.4 C) (Oral)   Resp 16   Ht 5\' 11"  (1.803 m)   Wt 111.1 kg   SpO2 100%   BMI 34.17 kg/m  Physical Exam Vitals and nursing note reviewed.  Constitutional:      General: He is not in acute distress.    Appearance: He is well-developed.  HENT:     Head: Normocephalic and atraumatic.  Eyes:     Conjunctiva/sclera: Conjunctivae normal.  Cardiovascular:     Rate and Rhythm: Normal rate and regular  rhythm.     Heart sounds: No murmur heard. Pulmonary:     Effort: Pulmonary effort is normal. No respiratory distress.     Breath sounds: Normal breath sounds.  Abdominal:     General: There is no distension.     Palpations: Abdomen is soft. There is no mass.     Tenderness: There is abdominal tenderness. There is no guarding.  Musculoskeletal:        General: No swelling.     Cervical back: Neck supple.  Skin:    General: Skin is warm and dry.     Capillary Refill: Capillary refill takes less than 2 seconds.  Neurological:     Mental Status: He is  alert.  Psychiatric:        Mood and Affect: Mood is anxious. Mood is not elated. Affect is not blunt or angry.     ED Results / Procedures / Treatments   Labs (all labs ordered are listed, but only abnormal results are displayed) Labs Reviewed  COMPREHENSIVE METABOLIC PANEL WITH GFR - Abnormal; Notable for the following components:      Result Value   ALT 46 (*)    All other components within normal limits  URINALYSIS, ROUTINE W REFLEX MICROSCOPIC - Abnormal; Notable for the following components:   APPearance HAZY (*)    All other components within normal limits  RAPID URINE DRUG SCREEN, HOSP PERFORMED - Abnormal; Notable for the following components:   Benzodiazepines POSITIVE (*)    All other components within normal limits  ACETAMINOPHEN LEVEL - Abnormal; Notable for the following components:   Acetaminophen (Tylenol), Serum <10 (*)    All other components within normal limits  SALICYLATE LEVEL - Abnormal; Notable for the following components:   Salicylate Lvl <7.0 (*)    All other components within normal limits  CBC WITH DIFFERENTIAL/PLATELET  LIPASE, BLOOD  ETHANOL    EKG None  Radiology CT ABDOMEN PELVIS W CONTRAST Result Date: 09/05/2023 CLINICAL DATA:  Abdominal pain, acute, nonlocalized Suicidal ideations and upset stomach. EXAM: CT ABDOMEN AND PELVIS WITH CONTRAST TECHNIQUE: Multidetector CT imaging of the abdomen and pelvis was performed using the standard protocol following bolus administration of intravenous contrast. RADIATION DOSE REDUCTION: This exam was performed according to the departmental dose-optimization program which includes automated exposure control, adjustment of the mA and/or kV according to patient size and/or use of iterative reconstruction technique. CONTRAST:  OMNIPAQUE IOHEXOL 300 MG/ML  SOLN COMPARISON:  Abdominopelvic CT 07/05/2023 and 08/28/2016. FINDINGS: Lower chest: Clear lung bases. No significant pleural or pericardial effusion.  Hepatobiliary: The liver is normal in density without suspicious focal abnormality. No evidence of gallstones, gallbladder wall thickening or biliary dilatation. Pancreas: Unremarkable. No pancreatic ductal dilatation or surrounding inflammatory changes. Spleen: Normal in size without focal abnormality. Adrenals/Urinary Tract: Both adrenal glands appear normal. No evidence of urinary tract calculus, suspicious renal lesion or hydronephrosis. The bladder appears unremarkable for its degree of distention. Stomach/Bowel: No enteric contrast administered. The stomach appears unremarkable for its degree of distension. No evidence of bowel wall thickening, distention or surrounding inflammatory change. The appendix appears normal. The colon is decompressed. Vascular/Lymphatic: There are no enlarged abdominal or pelvic lymph nodes. No significant vascular findings. Reproductive: The prostate gland and seminal vesicles appear normal. Other: No evidence of abdominal wall mass or hernia. No ascites or pneumoperitoneum. Musculoskeletal: No acute or significant osseous findings. IMPRESSION: No acute findings or explanation for the patient's symptoms. Electronically Signed   By: Chrissie Noa  Purcell Mouton M.D.   On: 09/05/2023 12:16    Procedures Procedures    Medications Ordered in ED Medications  alum & mag hydroxide-simeth (MAALOX/MYLANTA) 200-200-20 MG/5ML suspension 30 mL (30 mLs Oral Given 09/05/23 1145)  famotidine (PEPCID) tablet 20 mg (20 mg Oral Given 09/05/23 1145)  iohexol (OMNIPAQUE) 300 MG/ML solution 100 mL (100 mLs Intravenous Contrast Given 09/05/23 1144)    ED Course/ Medical Decision Making/ A&P                                 Medical Decision Making Amount and/or Complexity of Data Reviewed Labs: ordered. Radiology: ordered.  Risk OTC drugs. Prescription drug management.   This patient presents to the ED for concern of psych evaluation.  Differential diagnosis includes anxiety, suicidal  ideation, depression, substance use  Lab Tests:  I Ordered, and personally interpreted labs.  The pertinent results include: CBC unremarkable, CMP unremarkable, ethanol negative, acetaminophen negative, salicylate level negative, UA without evidence of infection, UDS positive for benzodiazepines   Imaging Studies ordered:  I ordered imaging studies including CT abdomen pelvis I independently visualized and interpreted imaging which showed negative for any findings I agree with the radiologist interpretation   Medicines ordered and prescription drug management:  I ordered medication including famotidine, Maalox for reflux/abdominal pain Reevaluation of the patient after these medicines showed that the patient improved I have reviewed the patients home medicines and have made adjustments as needed   Problem List / ED Course:  Patient presents to the emergency department today with concerns of increased anxiety some abdominal pain.  Reports that the anxiety is poorly controlled this time and currently takes Xanax 0.125mg  without significant improvement in symptoms.  Has tried multiple SSRIs, SNRIs, TCAs but states that he has had a prior reaction and development of serotonin syndrome.  Reports he is currently looking for a new psychiatrist and current has an appointment scheduled for next week for further evaluation.  He currently states that he is taking lamotrigine, hydroxyzine, and clonidine.  Denies any feelings of suicidal ideation but feels increasing hopelessness of getting his anxiety under control, thoughts of self-harm, thoughts of harming others, or any auditory or visual hallucinations.  Does also endorse some generalized abdominal pain primarily present in the mornings before eating breakfast.  No recent hematemesis, medic easier, or melanotic stools. Physical exam is largely reassuring with no abnormal heart or lung sounds.  Some generalized abdominal pain.  Will obtain labs and  imaging for further assessment of symptoms. Patient's lab workup is unremarkable.  No leukocytosis, renal dysfunction, and only mild elevation in ALT.  Ethanol, acetaminophen, salicylate levels all negative.  UDS positive for benzodiazepines which is consistent with patient's Xanax use.  Lipase unremarkable.  Urine negative for any signs of infection. CT on pelvis is negative for any explainable source of his current abdominal discomfort.  Patient did report relief with combination of Maalox and famotidine.  I suspect he likely has a gastric ulcer present potentially secondary to reportedly manage stress and anxiety. Will discharge patient with plans for outpatient follow-up with psychiatry in the next week.  Advised patient that they will likely be the ones to manage any modifications to his psychiatric medications.  Will increase patient's Xanax dose from 0.125 to 0.25 mg 3 times daily as needed.   Final Clinical Impression(s) / ED Diagnoses Final diagnoses:  Anxiety  Acute gastric ulcer, unspecified whether gastric ulcer hemorrhage or  perforation present    Rx / DC Orders ED Discharge Orders          Ordered    omeprazole (PRILOSEC) 20 MG capsule  Daily        09/05/23 1307    ALPRAZolam (XANAX) 0.25 MG tablet  3 times daily PRN        09/05/23 1307              Smitty Knudsen, PA-C 09/05/23 1554    Margarita Grizzle, MD 09/10/23 1149

## 2023-09-05 NOTE — ED Triage Notes (Signed)
 Patient c/o SI, upset stomach, mental health issues Pain rated 4/10 stomach

## 2023-09-05 NOTE — Discharge Instructions (Signed)
 You were seen in the ER today for concerns of poorly controlled anxiety and abdominal pain.  Your labs and CT imaging were thankfully negative for any acute findings seen.  I do suspect likely have a gastric ulcer from reported controlled anxiety.  I believe would be beneficial to start you on a proton pump inhibitor medication for the next 2 weeks.  You should follow-up with your psychiatrist for further management of your medications as you do not appear to be well controlled on your current treatment regimen. Prescription for a refill of your Xanax was sent to your pharmacy. Please take this as prescribed.  For any concerns of new or worsening symptoms, return to the emergency department.

## 2023-09-07 DIAGNOSIS — F411 Generalized anxiety disorder: Secondary | ICD-10-CM | POA: Diagnosis not present

## 2023-09-07 DIAGNOSIS — F0632 Mood disorder due to known physiological condition with major depressive-like episode: Secondary | ICD-10-CM | POA: Diagnosis not present

## 2023-09-09 DIAGNOSIS — F331 Major depressive disorder, recurrent, moderate: Secondary | ICD-10-CM | POA: Diagnosis not present

## 2023-09-09 DIAGNOSIS — F330 Major depressive disorder, recurrent, mild: Secondary | ICD-10-CM | POA: Diagnosis not present

## 2023-09-09 DIAGNOSIS — F411 Generalized anxiety disorder: Secondary | ICD-10-CM | POA: Diagnosis not present

## 2023-09-09 DIAGNOSIS — F0632 Mood disorder due to known physiological condition with major depressive-like episode: Secondary | ICD-10-CM | POA: Diagnosis not present

## 2023-09-15 DIAGNOSIS — F330 Major depressive disorder, recurrent, mild: Secondary | ICD-10-CM | POA: Diagnosis not present

## 2023-09-21 DIAGNOSIS — F330 Major depressive disorder, recurrent, mild: Secondary | ICD-10-CM | POA: Diagnosis not present

## 2023-09-22 DIAGNOSIS — F4323 Adjustment disorder with mixed anxiety and depressed mood: Secondary | ICD-10-CM | POA: Diagnosis not present

## 2023-09-23 DIAGNOSIS — R5383 Other fatigue: Secondary | ICD-10-CM | POA: Diagnosis not present

## 2023-09-23 DIAGNOSIS — L744 Anhidrosis: Secondary | ICD-10-CM | POA: Diagnosis not present

## 2023-09-23 DIAGNOSIS — R6889 Other general symptoms and signs: Secondary | ICD-10-CM | POA: Diagnosis not present

## 2023-09-28 DIAGNOSIS — F0632 Mood disorder due to known physiological condition with major depressive-like episode: Secondary | ICD-10-CM | POA: Diagnosis not present

## 2023-09-28 DIAGNOSIS — R82998 Other abnormal findings in urine: Secondary | ICD-10-CM | POA: Diagnosis not present

## 2023-09-28 DIAGNOSIS — F411 Generalized anxiety disorder: Secondary | ICD-10-CM | POA: Diagnosis not present

## 2023-09-28 DIAGNOSIS — F4323 Adjustment disorder with mixed anxiety and depressed mood: Secondary | ICD-10-CM | POA: Diagnosis not present

## 2023-09-28 DIAGNOSIS — L744 Anhidrosis: Secondary | ICD-10-CM | POA: Diagnosis not present

## 2023-09-30 DIAGNOSIS — F411 Generalized anxiety disorder: Secondary | ICD-10-CM | POA: Diagnosis not present

## 2023-09-30 DIAGNOSIS — F0632 Mood disorder due to known physiological condition with major depressive-like episode: Secondary | ICD-10-CM | POA: Diagnosis not present

## 2023-10-01 ENCOUNTER — Ambulatory Visit (INDEPENDENT_AMBULATORY_CARE_PROVIDER_SITE_OTHER): Admitting: Behavioral Health

## 2023-10-01 ENCOUNTER — Encounter: Payer: Self-pay | Admitting: Behavioral Health

## 2023-10-01 DIAGNOSIS — F99 Mental disorder, not otherwise specified: Secondary | ICD-10-CM | POA: Diagnosis not present

## 2023-10-01 DIAGNOSIS — F331 Major depressive disorder, recurrent, moderate: Secondary | ICD-10-CM

## 2023-10-01 DIAGNOSIS — F411 Generalized anxiety disorder: Secondary | ICD-10-CM | POA: Diagnosis not present

## 2023-10-01 DIAGNOSIS — F39 Unspecified mood [affective] disorder: Secondary | ICD-10-CM

## 2023-10-01 DIAGNOSIS — F5105 Insomnia due to other mental disorder: Secondary | ICD-10-CM

## 2023-10-01 MED ORDER — LORAZEPAM 1 MG PO TABS: 1.0000 mg | ORAL_TABLET | Freq: Three times a day (TID) | ORAL | 0 refills | Status: DC | PRN

## 2023-10-01 MED ORDER — DESVENLAFAXINE SUCCINATE ER 50 MG PO TB24: 50.0000 mg | ORAL_TABLET | Freq: Every day | ORAL | 1 refills | Status: AC

## 2023-10-01 MED ORDER — CLONIDINE HCL 0.1 MG PO TABS: 0.1000 mg | ORAL_TABLET | Freq: Three times a day (TID) | ORAL | 1 refills | Status: AC | PRN

## 2023-10-01 MED ORDER — LAMOTRIGINE 100 MG PO TABS: 100.0000 mg | ORAL_TABLET | Freq: Every day | ORAL | 1 refills | Status: AC

## 2023-10-01 NOTE — Progress Notes (Addendum)
 [facility] MD/PA/NP Initial Note  10/01/2023 5:58 PM Derek Hoffman  MRN:  [id number]  Chief Complaint:  Chief Complaint   Depression; Anxiety; Establish Care; Medication Problem; Medication Refill; Patient Education     HPI:  "Derek Hoffman", 33 year old male presents to this office for initial visit and to establish care.  Patient was not entirely forthcoming with information and had to refer to previous chart notes for further inquiry with patient.  He appears anxious.  Says that he is previous and patient of [facility].  Says that he did not like the way they handled his medications.  Says that he has had bad experience with psychiatric medications leading to serotonin syndrome.  He said that most of the medications with caused him to lose control of his legs where he felt like he could not stand.  Denies any fever during these episodes.  He did present to the ER for same.  Patient has multiple ER visits in the last few months.  Patient states that he also had a 5-day stay and [facility] residential treatment program.  Says that he did not feel comfortable there so he signed himself out.  Patient states that he most recently participated in 30-day residential program at [facility].  Says that he has a long history of anxiety, depression, and ADHD stemming back to teen years.  When asked what he was previously diagnosed with he was not forthcoming and mentioning ADHD.  He said he was diagnosed just with anxiety and depression.  Patient states that he had symptoms of serotonin syndrome when trying various SSRIs and SNRI medication.  Was unable to locate official diagnosis and chart notes.  Says that he is here today because of severe depression and anxiety and has been unable to successfully treat.  He endorses frequent anxiety, racing thoughts, reduced sleep and not missing it.  Says that he does not understand why chart notes suggested that he had severe depression with psychosis and that he had  never experienced auditory or visual hallucinations.  Says that he has experienced hearing sounds "when he is trying to sleep like hearing a door closed or something like that but that is all".  On review of chart notes from 06/22/2023 he reported auditory hallucinations and ER visit.  Stated that he had been on Cymbalta for 5 or 6 days and her voices telling him to "do it" which he believes meant commit suicide.  PT was not forthcoming with this information.  He did previously complete GeneSight testing, and was agreeable to try a different medication listed in the recommended column.  He understands that medications continue to produce perceived serotonin syndrome that he may have to try nonconventional medicine or more intensive psychotherapy.  Reports anxiety 7/10, and depression 7/10.  Says that he is sleeping 4 to 5 hours per night.  Does take 10 mg of melatonin to assist.  Was unable to complete MDQ today but will reassess next visit.  PHQ-9 was score of 19.  Denies any current  illicit drug use or EtOH.  He denies current mania, no psychosis, no auditory or visual hallucinations.  Denies SI or HI.  Patient states that he feels safe and verbally contracts for safety with this Clinical research associate.  Acknowledges strong family support.  Past psychiatric medication trials: Citalopram -on medication with no issues for 15 years Xanax  Hydroxyzine  Cymbalta-hold medication for 2 weeks, may have developed prolonged QRS interval.  Developed SI, reported hallucinations Lexapro  Prozac Abilify Trazodone  Ativan  Mirtazapine  Haldol  Viibryd-prescribed medication  but never taken Vraylar-stopped medication after short period Lamictal    Visit Diagnosis:    ICD-10-CM   1. Major depressive disorder, recurrent episode, moderate (HCC)  F33.1 desvenlafaxine  (PRISTIQ ) 50 MG 24 hr tablet    lamoTRIgine  (LAMICTAL ) 100 MG tablet    2. Generalized anxiety disorder  F41.1 desvenlafaxine  (PRISTIQ ) 50 MG 24 hr tablet    cloNIDine   (CATAPRES ) 0.1 MG tablet    LORazepam  (ATIVAN ) 1 MG tablet    3. Unspecified mood (affective) disorder (HCC)  F39 lamoTRIgine  (LAMICTAL ) 100 MG tablet    4. 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;  Service: Orthopedics;  Laterality: Left;   KNEE ARTHROSCOPY WITH LATERAL MENISECTOMY Left 01/11/2015   Procedure: LEFT KNEE ARTHROSCOPY  WITH LATERAL MENISCECTOMY, EXCISION OF MEDIAL PLICA, CHONDROPLASTY;  Surgeon: Ferd Householder, MD;  Location: [facility];  Service: Orthopedics;  Laterality: Left;   NASAL SEPTOPLASTY W/ TURBINOPLASTY Bilateral 10/08/2020   Procedure: REVISION NASAL SEPTOPLASTY WITH TURBINATE REDUCTION AND SEPTUM REVISION;  Surgeon: Reynold Caves, MD;  Location: [facility];  Service: ENT;  Laterality: Bilateral;   ORIF FEMUR FRACTURE Left    SEPTOPLASTY     SHOULDER ARTHROSCOPY WITH ROTATOR CUFF REPAIR Left 06/27/2021   Procedure: Left Shoulder arthroscopy, debridement, subacromial decompression, distal clavicle resection,;  Surgeon: Ellard Gunning, MD;  Location: [facility];  Service: Orthopedics;  Laterality: Left;     TONSILLECTOMY AND ADENOIDECTOMY     WISDOM TOOTH EXTRACTION      Family Psychiatric History: see chart  Family History:  Family History  Problem Relation Age of Onset   Hypertension Father    Migraines Neg Hx     Social History:  Social History   Socioeconomic History   Marital status: Single    Spouse name: Not on file   Number of children: 0   Years of education: Not on file   Highest education level: Not on file  Occupational History   Occupation: Transport planner  Tobacco Use   Smoking status: Former    Current packs/day: 0.00    Average packs/day: 0.3 packs/day for 1 year (0.3 ttl pk-yrs)    Types: Cigarettes    Start date: 2011    Quit date: 2012    Years since quitting: 13.3   Smokeless tobacco: Former    Types: Snuff    Quit date: 2021  Vaping Use   Vaping status: Every Day   Substances: Nicotine   Substance and Sexual Activity   Alcohol use: Not Currently    Comment: occasionally   Drug use: No  Sexual activity: Not on file  Other Topics Concern   Not on file  Social History Narrative   ** Merged History Encounter ** Lives w/ MFRegular exercise- noGoes to college and works.  Working [employer].   Education: HS diploma with some college.    Caffiene 2 sodas daily    Social Drivers of Corporate investment banker Strain: Low Risk  (09/09/2023)   Received from [facility]   Overall Financial Resource Strain (CARDIA)    Difficulty of Paying Living Expenses: Not hard at all  Food Insecurity: No Food Insecurity (09/09/2023)   Received [facility]   Hunger Vital Sign    Worried About Running Out of Food in the Last Year: Never true    Ran Out of Food in the Last Year: Never true  Transportation Needs: No Transportation Needs (09/09/2023)   Received from [facility] - Transportation    Lack of Transportation (Medical): No    Lack of Transportation (Non-Medical): No  Physical Activity: Unknown (09/09/2023)   Received [facility]   Exercise Vital Sign    Days of Exercise per Week: 0 days    Minutes of Exercise per Session: Not on file  Stress: Stress Concern Present (09/09/2023)   Received from [facility] - Occupational Stress Questionnaire    Feeling of Stress : To some extent  Social Connections: Socially Isolated (09/09/2023)   Received from [facility]   Social Network    How would you rate your social network (family, work, friends)?: Little participation, lonely and socially isolated    Allergies:  Allergies  Allergen Reactions   Abilify [Aripiprazole]    Cymbalta [Duloxetine Hcl] Other (See Comments)    Hallucination, suicide ideation.   Prozac [Fluoxetine] Other (See Comments)    Serotonin syndrome    Metabolic Disorder Labs: Lab Results  Component Value Date   HGBA1C 5.2 08/21/2021   No results found for: "PROLACTIN" Lab Results  Component Value Date   CHOL 170 08/21/2021   TRIG 99 08/21/2021   HDL 34 (L) 08/21/2021   CHOLHDL 5.0 08/21/2021   VLDL 24.4 07/10/2011   LDLCALC 118 (H) 08/21/2021   LDLCALC 103 (H) 07/10/2011   Lab Results  Component Value Date   TSH 1.215 08/02/2023   TSH 0.802 06/15/2023    Therapeutic Level Labs: No results found for: "LITHIUM" No results found for: "VALPROATE" No results found for: "CBMZ"  Current Medications: Current Outpatient Medications  Medication Sig Dispense Refill   desvenlafaxine  (PRISTIQ ) 50 MG 24 hr tablet Take 1 tablet (50 mg total) by mouth daily. 30 tablet 1   lamoTRIgine  (LAMICTAL ) 100 MG tablet Take 1 tablet (100 mg total) by mouth daily. 30 tablet 1   acetaminophen  (TYLENOL ) 325 MG tablet Take 2 tablets (650 mg total) by mouth every 6 (six) hours as needed for mild pain (pain score 1-3) (or Fever >/= 101).     cloNIDine  (CATAPRES ) 0.1 MG tablet Take 1 tablet (0.1 mg total) by mouth 3 (three) times daily as needed. 60 tablet 1   [START ON 10/08/2023] LORazepam  (ATIVAN ) 1 MG tablet  Take 1 tablet (1 mg total) by mouth every 8 (eight) hours as needed. 30 tablet 0   omeprazole  (PRILOSEC) 20 MG capsule Take 1 capsule (20 mg total) by mouth daily for 15 days. 15 capsule 0   No current facility-administered medications for this visit.    Medication Side Effects:  none  Orders placed this visit:  No orders of the defined types were placed in this encounter.   Psychiatric Specialty Exam:  Review of Systems  Constitutional: Negative.   Allergic/Immunologic: Negative.   Neurological: Negative.   Psychiatric/Behavioral:  Positive for dysphoric mood. The patient is nervous/anxious.     There were no vitals taken for this visit.There is no height or weight on file to calculate BMI.  General Appearance: Casual and Neat  Eye Contact:  Good  Speech:  Clear and Coherent  Volume:  Normal  Mood:  NA  Affect:  Appropriate  Thought Process:  Coherent  Orientation:  Full (Time, Place, and Person)  Thought Content: Logical   Suicidal Thoughts:  No  Homicidal Thoughts:  No  Memory:  WNL  Judgement:  Fair  Insight:  Good  Psychomotor Activity:  Normal  Concentration:  Concentration: Good  Recall:  Good  Fund of Knowledge: Good  Language: Good  Assets:  Desire for Improvement  ADL's:  Intact  Cognition: WNL  Prognosis:  Good   Screenings:  AUDIT    Flowsheet Row Admission (Discharged) from 06/22/2023 in [facility]  Alcohol Use Disorder Identification Test Final Score (AUDIT) 0      PHQ2-9    Flowsheet Row Office Visit from 10/01/2023 in [facility]  PHQ-2 Total Score 6  PHQ-9 Total Score 19      Flowsheet Row ED from 09/05/2023 in [facility] Emergency Department at [facility] ED to Hosp-Admission (Discharged) from 08/01/2023 in [facility] ED from 07/13/2023 in [facility] Emergency Department at [facility]  C-SSRS RISK CATEGORY High Risk No Risk No Risk       Receiving  Psychotherapy: No   Treatment Plan/Recommendations:  Greater than 50% of  60 min face to face time with patient was spent on counseling and coordination of care. We discussed his long history of anxiety, depression, and ADHD stemming back to teen years.  We discussed his previous plan of care along with past psychiatric medication trials.  He has a very extensive list of reported side effects with various medications to include reported serotonin syndrome, and suicidal ideation.  Chart notes revealed prolonged QT interval after starting regimen of Cymbalta.  Could not determine follow-up post episode.  Reviewed GeneSight results and Pristiq  was listed and recommended,.  Patient agrees to trial of medication.  I explained to the patient that after many different medication trials with different classes of medication that I had concerns that medication therapy would be successful.  We had conversation about TMS.  He had questions about ECT therapy. I recommended that he continue in psychotherapy on a regular basis and that it may be beneficial to have full psychological evaluation.  Will provide resources next visit.  We agreed today to: To start Pristiq  50 mg daily in the a.m. after breakfast To continue clonidine  0.  1 mg at bedtime Will continue Ativan  1 mg 3 times daily as necessary for severe anxiety Patient will continue melatonin 10 mg at bedtime Patient to continue Lamictal  100 mg daily Will report worsening symptoms or side effects promptly.  Patient has had previous education and was reinforced if developing symptoms of serotonin syndrome. Provided emergency contact information number To follow-up in 4 weeks to reassess Monitor for any sign of rash. Please stop taking Lamictal  and contact office immediately rash develops. Recommend seeking urgent medical attention if rash is severe and/or spreading quickly.  Reviewed PDMP    Lincoln Renshaw, NP

## 2023-10-05 ENCOUNTER — Telehealth: Payer: Self-pay | Admitting: Behavioral Health

## 2023-10-05 DIAGNOSIS — F0632 Mood disorder due to known physiological condition with major depressive-like episode: Secondary | ICD-10-CM | POA: Diagnosis not present

## 2023-10-05 DIAGNOSIS — F411 Generalized anxiety disorder: Secondary | ICD-10-CM | POA: Diagnosis not present

## 2023-10-05 DIAGNOSIS — F4323 Adjustment disorder with mixed anxiety and depressed mood: Secondary | ICD-10-CM | POA: Diagnosis not present

## 2023-10-05 NOTE — Telephone Encounter (Signed)
 Pt called 11:57 reporting he has not started meds yet. Let BW know. He has apt with Neurologist Friday and would like to see her before starting. Pt to notify office after apt and advise if he will be starting meds.

## 2023-10-05 NOTE — Telephone Encounter (Signed)
 Just FYI.

## 2023-10-05 NOTE — Telephone Encounter (Signed)
 Please advise him that he will need to call and change appointment with me then, to 4 weeks out from time he starts med if he decides to take. If he decides not to take medication, then he does not need to RS with me at this time.

## 2023-10-06 NOTE — Telephone Encounter (Signed)
 Patient advised of recommendation to push appt out for 4 weeks after he starts medication.

## 2023-10-07 DIAGNOSIS — F0632 Mood disorder due to known physiological condition with major depressive-like episode: Secondary | ICD-10-CM | POA: Diagnosis not present

## 2023-10-07 DIAGNOSIS — F411 Generalized anxiety disorder: Secondary | ICD-10-CM | POA: Diagnosis not present

## 2023-10-09 ENCOUNTER — Ambulatory Visit (INDEPENDENT_AMBULATORY_CARE_PROVIDER_SITE_OTHER): Payer: 59 | Admitting: Adult Health

## 2023-10-09 ENCOUNTER — Encounter: Payer: Self-pay | Admitting: Adult Health

## 2023-10-09 ENCOUNTER — Telehealth: Payer: Self-pay | Admitting: Behavioral Health

## 2023-10-09 VITALS — BP 139/89 | HR 82 | Ht 70.0 in | Wt 253.4 lb

## 2023-10-09 DIAGNOSIS — R569 Unspecified convulsions: Secondary | ICD-10-CM | POA: Diagnosis not present

## 2023-10-09 DIAGNOSIS — Z87898 Personal history of other specified conditions: Secondary | ICD-10-CM

## 2023-10-09 NOTE — Telephone Encounter (Signed)
 Please see message and advise

## 2023-10-09 NOTE — Telephone Encounter (Signed)
 Going to consult with Dr. Toi Foster about this.
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
## 2023-10-09 NOTE — Telephone Encounter (Signed)
 Pt called 12:41 reporting neurologist does not recommend starting med due to previous serotonin syndrome. Pt mentioned PET scan and was advised provider BW would be the one to order per neurologist. Advise pt @ [phone]

## 2023-10-12 ENCOUNTER — Encounter: Payer: Self-pay | Admitting: Neurology

## 2023-10-12 ENCOUNTER — Encounter: Payer: Self-pay | Admitting: Adult Health

## 2023-10-12 DIAGNOSIS — F0632 Mood disorder due to known physiological condition with major depressive-like episode: Secondary | ICD-10-CM | POA: Diagnosis not present

## 2023-10-12 DIAGNOSIS — F4323 Adjustment disorder with mixed anxiety and depressed mood: Secondary | ICD-10-CM | POA: Diagnosis not present

## 2023-10-12 DIAGNOSIS — F411 Generalized anxiety disorder: Secondary | ICD-10-CM | POA: Diagnosis not present

## 2023-10-12 NOTE — Telephone Encounter (Signed)
 Pt calling to speak with megan millikan Np stated that he never heard back but her note says should followup w/psych

## 2023-10-12 NOTE — Telephone Encounter (Signed)
 Pt saw Megan last Friday. He also messaged Megan about this separately and I am waiting to hear back from her.

## 2023-10-13 NOTE — Telephone Encounter (Signed)
This is being handled in another encounter.

## 2023-10-14 DIAGNOSIS — F0632 Mood disorder due to known physiological condition with major depressive-like episode: Secondary | ICD-10-CM | POA: Diagnosis not present

## 2023-10-14 DIAGNOSIS — F411 Generalized anxiety disorder: Secondary | ICD-10-CM | POA: Diagnosis not present

## 2023-10-15 ENCOUNTER — Telehealth: Payer: Self-pay | Admitting: Behavioral Health

## 2023-10-15 NOTE — Telephone Encounter (Signed)
 Patient called in stating that his prescription for Pristiq  50mg  is making him sick. Discussed with BW that if it wasn't working or making him feel sick he could try something else. Pls rtc [phone] Appt 5/22

## 2023-10-15 NOTE — Telephone Encounter (Signed)
 Reviewed recommendations with patient.

## 2023-10-15 NOTE — Telephone Encounter (Signed)
 Patient reporting he has been on Pristiq  50 mg for 4 days. When I asked about sx he said pretty much the same as when he had serotonin syndrome. He said his legs hurt, his hands were trembling. He is taking in the AM with food.

## 2023-10-19 ENCOUNTER — Telehealth: Payer: Self-pay | Admitting: Behavioral Health

## 2023-10-19 DIAGNOSIS — F411 Generalized anxiety disorder: Secondary | ICD-10-CM | POA: Diagnosis not present

## 2023-10-19 DIAGNOSIS — F0632 Mood disorder due to known physiological condition with major depressive-like episode: Secondary | ICD-10-CM | POA: Diagnosis not present

## 2023-10-19 NOTE — Telephone Encounter (Signed)
 Pt called at 10:36a requesting refill of Lorazepam .  He said he has about 10 or 11 pills left.  Send script to   [facility] [address], [place] - [address] [place] [address] Phone: [phone]  Fax: [phone]   Next appt 5/22

## 2023-10-20 ENCOUNTER — Other Ambulatory Visit: Payer: Self-pay | Admitting: Behavioral Health

## 2023-10-20 DIAGNOSIS — F411 Generalized anxiety disorder: Secondary | ICD-10-CM

## 2023-10-20 NOTE — Telephone Encounter (Signed)
 Addressed thru pharmacy interface.

## 2023-10-20 NOTE — Telephone Encounter (Signed)
 LF 4/27 for #30, q8h prn

## 2023-10-26 DIAGNOSIS — F0632 Mood disorder due to known physiological condition with major depressive-like episode: Secondary | ICD-10-CM | POA: Diagnosis not present

## 2023-10-26 DIAGNOSIS — F411 Generalized anxiety disorder: Secondary | ICD-10-CM | POA: Diagnosis not present

## 2023-10-28 DIAGNOSIS — F411 Generalized anxiety disorder: Secondary | ICD-10-CM | POA: Diagnosis not present

## 2023-10-28 DIAGNOSIS — F333 Major depressive disorder, recurrent, severe with psychotic symptoms: Secondary | ICD-10-CM | POA: Diagnosis not present

## 2023-10-29 ENCOUNTER — Ambulatory Visit: Admitting: Behavioral Health

## 2023-10-29 NOTE — Progress Notes (Signed)
 No charge. Pt should not be RS. Conversation had with PT that he needs higher level of care and will possible need to consult with neuropsychiatry.  Medications will be bridged for 2 months.

## 2023-10-30 DIAGNOSIS — F0632 Mood disorder due to known physiological condition with major depressive-like episode: Secondary | ICD-10-CM | POA: Diagnosis not present

## 2023-10-30 DIAGNOSIS — F411 Generalized anxiety disorder: Secondary | ICD-10-CM | POA: Diagnosis not present

## 2023-11-04 DIAGNOSIS — F411 Generalized anxiety disorder: Secondary | ICD-10-CM | POA: Diagnosis not present

## 2023-11-04 DIAGNOSIS — F4323 Adjustment disorder with mixed anxiety and depressed mood: Secondary | ICD-10-CM | POA: Diagnosis not present

## 2023-11-04 DIAGNOSIS — F0632 Mood disorder due to known physiological condition with major depressive-like episode: Secondary | ICD-10-CM | POA: Diagnosis not present

## 2023-11-10 DIAGNOSIS — F411 Generalized anxiety disorder: Secondary | ICD-10-CM | POA: Diagnosis not present

## 2023-11-10 DIAGNOSIS — F0632 Mood disorder due to known physiological condition with major depressive-like episode: Secondary | ICD-10-CM | POA: Diagnosis not present

## 2023-11-10 DIAGNOSIS — F4323 Adjustment disorder with mixed anxiety and depressed mood: Secondary | ICD-10-CM | POA: Diagnosis not present

## 2023-11-11 DIAGNOSIS — F411 Generalized anxiety disorder: Secondary | ICD-10-CM | POA: Diagnosis not present

## 2023-11-11 DIAGNOSIS — F0632 Mood disorder due to known physiological condition with major depressive-like episode: Secondary | ICD-10-CM | POA: Diagnosis not present

## 2023-11-13 DIAGNOSIS — F411 Generalized anxiety disorder: Secondary | ICD-10-CM | POA: Diagnosis not present

## 2023-11-13 DIAGNOSIS — F410 Panic disorder [episodic paroxysmal anxiety] without agoraphobia: Secondary | ICD-10-CM | POA: Diagnosis not present

## 2023-11-13 DIAGNOSIS — F332 Major depressive disorder, recurrent severe without psychotic features: Secondary | ICD-10-CM | POA: Diagnosis not present

## 2023-11-13 DIAGNOSIS — F445 Conversion disorder with seizures or convulsions: Secondary | ICD-10-CM | POA: Diagnosis not present

## 2023-11-16 DIAGNOSIS — F4323 Adjustment disorder with mixed anxiety and depressed mood: Secondary | ICD-10-CM | POA: Diagnosis not present

## 2023-11-19 DIAGNOSIS — F0632 Mood disorder due to known physiological condition with major depressive-like episode: Secondary | ICD-10-CM | POA: Diagnosis not present

## 2023-11-19 DIAGNOSIS — F411 Generalized anxiety disorder: Secondary | ICD-10-CM | POA: Diagnosis not present

## 2023-11-23 DIAGNOSIS — F4323 Adjustment disorder with mixed anxiety and depressed mood: Secondary | ICD-10-CM | POA: Diagnosis not present

## 2023-11-26 DIAGNOSIS — F0632 Mood disorder due to known physiological condition with major depressive-like episode: Secondary | ICD-10-CM | POA: Diagnosis not present

## 2023-11-26 DIAGNOSIS — F411 Generalized anxiety disorder: Secondary | ICD-10-CM | POA: Diagnosis not present

## 2023-11-26 DIAGNOSIS — F320 Major depressive disorder, single episode, mild: Secondary | ICD-10-CM | POA: Diagnosis not present

## 2023-11-30 DIAGNOSIS — F4323 Adjustment disorder with mixed anxiety and depressed mood: Secondary | ICD-10-CM | POA: Diagnosis not present

## 2023-12-02 DIAGNOSIS — F411 Generalized anxiety disorder: Secondary | ICD-10-CM | POA: Diagnosis not present

## 2023-12-02 DIAGNOSIS — F0632 Mood disorder due to known physiological condition with major depressive-like episode: Secondary | ICD-10-CM | POA: Diagnosis not present

## 2023-12-04 ENCOUNTER — Other Ambulatory Visit (HOSPITAL_COMMUNITY): Payer: Self-pay

## 2023-12-04 ENCOUNTER — Other Ambulatory Visit (HOSPITAL_BASED_OUTPATIENT_CLINIC_OR_DEPARTMENT_OTHER): Payer: Self-pay

## 2023-12-04 MED ORDER — LORAZEPAM 0.5 MG PO TABS
ORAL_TABLET | ORAL | 0 refills | Status: DC
  Filled 2023-12-04 – 2023-12-05 (×3): qty 63, 14d supply, fill #0

## 2023-12-05 ENCOUNTER — Other Ambulatory Visit (HOSPITAL_COMMUNITY): Payer: Self-pay

## 2023-12-07 ENCOUNTER — Other Ambulatory Visit (HOSPITAL_COMMUNITY): Payer: Self-pay

## 2023-12-08 DIAGNOSIS — F332 Major depressive disorder, recurrent severe without psychotic features: Secondary | ICD-10-CM | POA: Diagnosis not present

## 2023-12-08 DIAGNOSIS — F410 Panic disorder [episodic paroxysmal anxiety] without agoraphobia: Secondary | ICD-10-CM | POA: Diagnosis not present

## 2023-12-08 DIAGNOSIS — F445 Conversion disorder with seizures or convulsions: Secondary | ICD-10-CM | POA: Diagnosis not present

## 2023-12-08 DIAGNOSIS — F411 Generalized anxiety disorder: Secondary | ICD-10-CM | POA: Diagnosis not present

## 2023-12-09 ENCOUNTER — Emergency Department (HOSPITAL_COMMUNITY)

## 2023-12-09 ENCOUNTER — Other Ambulatory Visit: Payer: Self-pay

## 2023-12-09 ENCOUNTER — Emergency Department (HOSPITAL_COMMUNITY)
Admission: EM | Admit: 2023-12-09 | Discharge: 2023-12-09 | Disposition: A | Discharge status: Home / Self Care | Attending: Student | Admitting: Student

## 2023-12-09 ENCOUNTER — Encounter (HOSPITAL_COMMUNITY): Payer: Self-pay

## 2023-12-09 DIAGNOSIS — Z743 Need for continuous supervision: Secondary | ICD-10-CM | POA: Diagnosis not present

## 2023-12-09 DIAGNOSIS — Z87891 Personal history of nicotine dependence: Secondary | ICD-10-CM | POA: Insufficient documentation

## 2023-12-09 DIAGNOSIS — F411 Generalized anxiety disorder: Secondary | ICD-10-CM | POA: Diagnosis not present

## 2023-12-09 DIAGNOSIS — F0632 Mood disorder due to known physiological condition with major depressive-like episode: Secondary | ICD-10-CM | POA: Diagnosis not present

## 2023-12-09 DIAGNOSIS — D72829 Elevated white blood cell count, unspecified: Secondary | ICD-10-CM | POA: Insufficient documentation

## 2023-12-09 DIAGNOSIS — R109 Unspecified abdominal pain: Secondary | ICD-10-CM | POA: Diagnosis not present

## 2023-12-09 DIAGNOSIS — R112 Nausea with vomiting, unspecified: Secondary | ICD-10-CM | POA: Diagnosis not present

## 2023-12-09 DIAGNOSIS — K529 Noninfective gastroenteritis and colitis, unspecified: Secondary | ICD-10-CM | POA: Diagnosis not present

## 2023-12-09 LAB — CBC WITH DIFFERENTIAL/PLATELET
Abs Immature Granulocytes: 0.05 10*3/uL (ref 0.00–0.07)
Basophils Absolute: 0 10*3/uL (ref 0.0–0.1)
Basophils Relative: 0 %
Eosinophils Absolute: 0 10*3/uL (ref 0.0–0.5)
Eosinophils Relative: 0 %
HCT: 51.3 % (ref 39.0–52.0)
Hemoglobin: 17.4 g/dL — ABNORMAL HIGH (ref 13.0–17.0)
Immature Granulocytes: 0 %
Lymphocytes Relative: 5 %
Lymphs Abs: 0.6 10*3/uL — ABNORMAL LOW (ref 0.7–4.0)
MCH: 28.6 pg (ref 26.0–34.0)
MCHC: 33.9 g/dL (ref 30.0–36.0)
MCV: 84.4 fL (ref 80.0–100.0)
Monocytes Absolute: 0.7 10*3/uL (ref 0.1–1.0)
Monocytes Relative: 5 %
Neutro Abs: 11.9 10*3/uL — ABNORMAL HIGH (ref 1.7–7.7)
Neutrophils Relative %: 90 %
Platelets: 204 10*3/uL (ref 150–400)
RBC: 6.08 MIL/uL — ABNORMAL HIGH (ref 4.22–5.81)
RDW: 12.6 % (ref 11.5–15.5)
WBC: 13.2 10*3/uL — ABNORMAL HIGH (ref 4.0–10.5)
nRBC: 0 % (ref 0.0–0.2)

## 2023-12-09 LAB — COMPREHENSIVE METABOLIC PANEL WITH GFR
ALT: 34 U/L (ref 0–44)
AST: 25 U/L (ref 15–41)
Albumin: 4.2 g/dL (ref 3.5–5.0)
Alkaline Phosphatase: 62 U/L (ref 38–126)
Anion gap: 12 (ref 5–15)
BUN: 12 mg/dL (ref 6–20)
CO2: 21 mmol/L — ABNORMAL LOW (ref 22–32)
Calcium: 9.4 mg/dL (ref 8.9–10.3)
Chloride: 103 mmol/L (ref 98–111)
Creatinine, Ser: 0.91 mg/dL (ref 0.61–1.24)
GFR, Estimated: >60 mL/min (ref >60)
Glucose, Bld: 129 mg/dL — ABNORMAL HIGH (ref 70–99)
Potassium: 3.4 mmol/L — ABNORMAL LOW (ref 3.5–5.1)
Sodium: 136 mmol/L (ref 135–145)
Total Bilirubin: 1.1 mg/dL (ref 0.0–1.2)
Total Protein: 7.3 g/dL (ref 6.5–8.1)

## 2023-12-09 LAB — LIPASE, BLOOD: Lipase: 31 U/L (ref 11–51)

## 2023-12-09 MED ORDER — POTASSIUM CHLORIDE CRYS ER 20 MEQ PO TBCR
40.0000 meq | EXTENDED_RELEASE_TABLET | Freq: Once | ORAL | Status: AC
  Administered 2023-12-09: 40 meq via ORAL
  Filled 2023-12-09: qty 2

## 2023-12-09 MED ORDER — LIDOCAINE VISCOUS HCL 2 % MT SOLN
15.0000 mL | Freq: Once | OROMUCOSAL | Status: AC
  Administered 2023-12-09: 15 mL via ORAL
  Filled 2023-12-09: qty 15

## 2023-12-09 MED ORDER — ACETAMINOPHEN 500 MG PO TABS
1000.0000 mg | ORAL_TABLET | Freq: Once | ORAL | Status: AC
  Administered 2023-12-09: 1000 mg via ORAL
  Filled 2023-12-09: qty 2

## 2023-12-09 MED ORDER — ONDANSETRON 4 MG PO TBDP: 4.0000 mg | ORAL_TABLET | Freq: Three times a day (TID) | ORAL | 0 refills | Status: AC | PRN

## 2023-12-09 MED ORDER — LORAZEPAM 1 MG PO TABS
1.0000 mg | ORAL_TABLET | Freq: Once | ORAL | Status: AC
  Administered 2023-12-09: 1 mg via ORAL
  Filled 2023-12-09: qty 1

## 2023-12-09 MED ORDER — PANTOPRAZOLE SODIUM 40 MG IV SOLR
40.0000 mg | Freq: Once | INTRAVENOUS | Status: AC
  Administered 2023-12-09: 40 mg via INTRAVENOUS
  Filled 2023-12-09: qty 10

## 2023-12-09 MED ORDER — LOPERAMIDE HCL 2 MG PO CAPS
4.0000 mg | ORAL_CAPSULE | Freq: Once | ORAL | Status: AC
  Administered 2023-12-09: 4 mg via ORAL
  Filled 2023-12-09: qty 2

## 2023-12-09 MED ORDER — ALUM & MAG HYDROXIDE-SIMETH 200-200-20 MG/5ML PO SUSP
30.0000 mL | Freq: Once | ORAL | Status: AC
  Administered 2023-12-09: 30 mL via ORAL
  Filled 2023-12-09: qty 30

## 2023-12-09 MED ORDER — SODIUM CHLORIDE (PF) 0.9 % IJ SOLN
INTRAMUSCULAR | Status: AC
Start: 2023-12-09 — End: 2023-12-09
  Filled 2023-12-09: qty 50

## 2023-12-09 MED ORDER — LORAZEPAM 0.5 MG PO TABS
0.5000 mg | ORAL_TABLET | Freq: Once | ORAL | Status: AC
  Administered 2023-12-09: 0.5 mg via ORAL
  Filled 2023-12-09: qty 1

## 2023-12-09 MED ORDER — ONDANSETRON HCL 4 MG/2ML IJ SOLN
4.0000 mg | Freq: Once | INTRAMUSCULAR | Status: AC
  Administered 2023-12-09: 4 mg via INTRAVENOUS
  Filled 2023-12-09: qty 2

## 2023-12-09 MED ORDER — SODIUM CHLORIDE 0.9 % IV BOLUS
1000.0000 mL | Freq: Once | INTRAVENOUS | Status: AC
  Administered 2023-12-09: 1000 mL via INTRAVENOUS

## 2023-12-09 MED ORDER — FAMOTIDINE IN NACL 20-0.9 MG/50ML-% IV SOLN
20.0000 mg | Freq: Once | INTRAVENOUS | Status: AC
  Administered 2023-12-09: 20 mg via INTRAVENOUS
  Filled 2023-12-09: qty 50

## 2023-12-09 MED ORDER — DICYCLOMINE HCL 20 MG PO TABS: 20.0000 mg | ORAL_TABLET | Freq: Three times a day (TID) | ORAL | 0 refills | Status: AC

## 2023-12-09 MED ORDER — DICYCLOMINE HCL 10 MG/ML IM SOLN
20.0000 mg | Freq: Once | INTRAMUSCULAR | Status: AC
  Administered 2023-12-09: 20 mg via INTRAMUSCULAR
  Filled 2023-12-09: qty 2

## 2023-12-09 MED ORDER — LACTATED RINGERS IV BOLUS
1000.0000 mL | Freq: Once | INTRAVENOUS | Status: AC
  Administered 2023-12-09: 1000 mL via INTRAVENOUS

## 2023-12-09 MED ORDER — IOHEXOL 300 MG/ML  SOLN
100.0000 mL | Freq: Once | INTRAMUSCULAR | Status: AC | PRN
  Administered 2023-12-09: 100 mL via INTRAVENOUS

## 2023-12-09 NOTE — ED Triage Notes (Signed)
 PT BIB EMS for N/V that started about 18:00. PT states he vomited 8 times and that he started a new anxiety medication today.

## 2023-12-09 NOTE — Discharge Instructions (Addendum)
 Be sure to drink plenty of fluids.  You may take over-the-counter medicine such as Pepto-Bismol or Imodium to help control your diarrhea.  You should also eat a liquid and/or bland diet.    If you develop worsening, continued, or recurrent abdominal pain, uncontrolled vomiting, fever, chest or back pain, or any other new/concerning symptoms then return to the ER for evaluation.

## 2023-12-09 NOTE — ED Provider Notes (Addendum)
 Patient states his abdominal pain continues though he is also feeling better.  Abdominal pain seems to be generalized.  He has a soft and benign abdominal exam.  Given his leukocytosis and continued pain I did discuss we could consider CT though with his symptoms of vomiting and diarrhea this is more likely to be gastroenteritis.  He prefers to hold off on CT which I think is reasonable as my suspicion of appendicitis, diverticulitis, bowel obstruction, etc. is all quite low.  He feels like this is because of his change from Ativan  to Klonopin .  He is requesting a dose of his typical Ativan  that he would take in the morning which I will give him here but discussed I cannot change his prescription or refill his Ativan .  He will need to talk to his primary psychiatrist for this.  Otherwise, will prescribe Bentyl  and Zofran  which he has had in the past for abdominal pain.  Will give return precautions.   9:01 AM Patient is now stating that his abdominal pain is getting worse. Will proceed with CT.   12:26 PM CT shows ileus vs developing SBO.  Clinically he is still having significant amounts of diarrhea and so I think a partial small bowel obstruction is unlikely.  Could be more ileus but again this seems more like a gastroenteritis.  He is requesting repeat dose of Bentyl  IM as well as some Tylenol .  I think this is reasonable as I think SBO is unlikely.  2:42 PM Patient reports he feels well enough for home.  He requested a second bag of fluid which was given.  He is not having any vomiting here though has had a couple diarrheal bowel movements.  He has been having diarrhea for the last couple days, which again I think makes SBO or even partial SBO much less likely.  He was offered admission multiple times throughout the ED stay due to his symptoms and questionable CT findings but he declines and wants to go home.  I think this is reasonable.  Will recommend bland/liquid diet and follow-up with PCP.  Will  give return precautions.   Freddi Hamilton, MD 12/09/23 [phone]

## 2023-12-09 NOTE — ED Notes (Signed)
 PT notified about urine sample; says he is unable to complete right now, but will try to attempt later

## 2023-12-09 NOTE — ED Provider Notes (Signed)
 [place] EMERGENCY DEPARTMENT AT [facility] Provider Note  CSN: [id number] Arrival date & time: 12/09/23 9648  Chief Complaint(s) Nausea and Emesis (PT BIB EMS for N/V that started about 18:00. PT states he vomited 8 times and that he started a new anxiety medication today.)  HPI Jiyan Walkowski is a 33 y.o. male with PMH anxiety, depression who presents emerged part for evaluation of nausea, vomiting.  States that he recently was transitioned off of Ativan  to Klonopin  and he states that he feels out of it.  States he cannot keep his Bentyl  down that usually helps with his abdominal pain and is feeling crampy abdominal pain.  Denies chest pain, shortness of breath, headache, fever or other systemic symptoms.   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   Depression    History of MRSA infection 2015   left arm   Lateral meniscus tear 12/2014   left   Serotonin syndrome    Patient Active Problem List   Diagnosis Date Noted   Seizure-like activity (HCC) 08/02/2023   Acute psychosis (HCC) 08/02/2023   Severe recurrent major depressive disorder with psychotic features (HCC) 06/22/2023   Generalized anxiety disorder 06/22/2023   Major depressive disorder, recurrent severe without psychotic features (HCC) 06/22/2023   Diarrhea 11/27/2022   Generalized abdominal pain 11/27/2022   Nausea 11/27/2022   Post concussive syndrome 01/21/2021   Olecranon bursitis of left elbow 03/31/2017   Strain of left quadriceps 09/03/2015   Other specified postprocedural states 05/28/2015   Post-traumatic osteoarthritis of left knee 03/20/2015   Chronic pain of left knee 03/06/2015   General medical examination 07/10/2011   Overweight 12/26/2010   Anxiety 12/18/2010   Home Medication(s) Prior to Admission medications   Medication Sig Start Date End Date Taking? Authorizing Provider  acetaminophen  (TYLENOL ) 325 MG  tablet Take 2 tablets (650 mg total) by mouth every 6 (six) hours as needed for mild pain (pain score 1-3) (or Fever >/= 101). 08/03/23   Rosario Leatrice FERNS, MD  cloNIDine  (CATAPRES ) 0.1 MG tablet Take 1 tablet (0.1 mg total) by mouth 3 (three) times daily as needed. 10/01/23   Teresa Redell LABOR, NP  desvenlafaxine  (PRISTIQ ) 50 MG 24 hr tablet Take 1 tablet (50 mg total) by mouth daily. Patient not taking: Reported on 10/09/2023 10/01/23   Teresa Redell A, NP  lamoTRIgine  (LAMICTAL ) 100 MG tablet Take 1 tablet (100 mg total) by mouth daily. 10/01/23   Teresa Redell LABOR, NP  LORazepam  (ATIVAN ) 1 MG tablet TAKE 1 TABLET BY MOUTH EVERY 8 HOURS AS NEEDED 10/21/23   Teresa Redell A, NP  omeprazole  (PRILOSEC) 20 MG capsule Take 1 capsule (20 mg total) by mouth daily for 15 days. 09/05/23 09/20/23  Zelaya, Oscar A, PA-C  Past Surgical History Past Surgical History:  Procedure Laterality Date   CHONDROPLASTY Left 01/11/2015   Procedure: CHONDROPLASTY;  Surgeon: Toribio JULIANNA Chancy, MD;  Location: [facility];  Service: Orthopedics;  Laterality: Left;   FEMUR HARDWARE REMOVAL Left 10/28/2007   KNEE ARTHROSCOPY Left 10/28/2007   with manipulation   KNEE ARTHROSCOPY WITH EXCISION PLICA Left 01/11/2015   Procedure: KNEE ARTHROSCOPY WITH EXCISION PLICA;  Surgeon: Toribio JULIANNA Chancy, MD;  Location: [facility];  Service: Orthopedics;  Laterality: Left;   KNEE ARTHROSCOPY WITH LATERAL MENISECTOMY Left 01/11/2015   Procedure: LEFT KNEE ARTHROSCOPY  WITH LATERAL MENISCECTOMY, EXCISION OF MEDIAL PLICA, CHONDROPLASTY;  Surgeon: Toribio JULIANNA Chancy, MD;  Location: [facility];  Service: Orthopedics;  Laterality: Left;   NASAL SEPTOPLASTY W/ TURBINOPLASTY Bilateral 10/08/2020   Procedure: REVISION NASAL SEPTOPLASTY WITH TURBINATE REDUCTION AND SEPTUM REVISION;  Surgeon:  Karis Clunes, MD;  Location: [facility];  Service: ENT;  Laterality: Bilateral;   ORIF FEMUR FRACTURE Left    RADIOFREQUENCY ABLATION Bilateral 05/2023   SEPTOPLASTY     SHOULDER ARTHROSCOPY WITH ROTATOR CUFF REPAIR Left 06/27/2021   Procedure: Left Shoulder arthroscopy, debridement, subacromial decompression, distal clavicle resection,;  Surgeon: Melita Drivers, MD;  Location: [facility];  Service: Orthopedics;  Laterality: Left;    TONSILLECTOMY AND ADENOIDECTOMY     WISDOM TOOTH EXTRACTION     Family History Family History  Problem Relation Age of Onset   Hypertension Father    Migraines Neg Hx     Social History Social History   Tobacco Use   Smoking status: Former    Current packs/day: 0.00    Average packs/day: 0.3 packs/day for 1 year (0.3 ttl pk-yrs)    Types: Cigarettes    Start date: 2011    Quit date: 2012    Years since quitting: 13.5   Smokeless tobacco: Former    Types: Snuff    Quit date: 2021  Vaping Use   Vaping status: Every Day   Substances: Nicotine   Substance Use Topics   Alcohol  use: Not Currently    Comment: occasionally   Drug use: No   Allergies Abilify [aripiprazole], Cymbalta [duloxetine hcl], and Prozac [fluoxetine]  Review of Systems Review of Systems  Gastrointestinal:  Positive for abdominal pain, nausea and vomiting.  Psychiatric/Behavioral:  The patient is nervous/anxious.     Physical Exam Vital Signs  I have reviewed the triage vital signs BP (!) 147/95 (BP Location: Left Arm)   Pulse 94   Temp 97.8 F (36.6 C) (Oral)   Resp 18   Wt 115 kg   SpO2 99%   BMI 36.38 kg/m   Physical Exam Constitutional:      General: He is not in acute distress.    Appearance: Normal appearance.  HENT:     Head: Normocephalic and atraumatic.     Nose: No congestion or rhinorrhea.  Eyes:     General:        Right eye: No discharge.        Left eye: No discharge.     Extraocular Movements: Extraocular movements intact.      Pupils: Pupils are equal, round, and reactive to light.  Cardiovascular:     Rate and Rhythm: Normal rate and regular rhythm.     Heart sounds: No murmur heard. Pulmonary:     Effort: No respiratory distress.     Breath sounds: No wheezing or rales.  Abdominal:  General: There is no distension.     Tenderness: There is no abdominal tenderness.  Musculoskeletal:        General: Normal range of motion.     Cervical back: Normal range of motion.  Skin:    General: Skin is warm and dry.  Neurological:     General: No focal deficit present.     Mental Status: He is alert.     ED Results and Treatments Labs (all labs ordered are listed, but only abnormal results are displayed) Labs Reviewed  CBC WITH DIFFERENTIAL/PLATELET - Abnormal; Notable for the following components:      Result Value   WBC 13.2 (*)    RBC 6.08 (*)    Hemoglobin 17.4 (*)    Neutro Abs 11.9 (*)    Lymphs Abs 0.6 (*)    All other components within normal limits  COMPREHENSIVE METABOLIC PANEL WITH GFR - Abnormal; Notable for the following components:   Potassium 3.4 (*)    CO2 21 (*)    Glucose, Bld 129 (*)    All other components within normal limits  LIPASE, BLOOD  URINALYSIS, ROUTINE W REFLEX MICROSCOPIC                                                                                                                          Radiology No results found.  Pertinent labs & imaging results that were available during my care of the patient were reviewed by me and considered in my medical decision making (see MDM for details).  Medications Ordered in ED Medications  famotidine  (PEPCID ) IVPB 20 mg premix (has no administration in time range)  alum & mag hydroxide-simeth (MAALOX/MYLANTA) 200-200-20 MG/5ML suspension 30 mL (has no administration in time range)    And  lidocaine  (XYLOCAINE ) 2 % viscous mouth solution 15 mL (has no administration in time range)  lactated ringers  bolus 1,000 mL (1,000 mLs  Intravenous New Bag/Given 12/09/23 0518)  ondansetron  (ZOFRAN ) injection 4 mg (4 mg Intravenous Given 12/09/23 0518)  dicyclomine  (BENTYL ) injection 20 mg (20 mg Intramuscular Given 12/09/23 0518)                                                                                                                                     Procedures Procedures  (including critical care time)  Medical Decision Making / ED Course   This patient presents to  the ED for concern of abdominal pain, nausea, vomiting, this involves an extensive number of treatment options, and is a complaint that carries with it a high risk of complications and morbidity.  The differential diagnosis includes GERD/gastritis, peptic ulcer disease, pancreatitis, gastroparesis, pneumonia, pleurisy, pericarditis  MDM: Patient seen in the emergency department for evaluation of epigastric abdominal pain nausea and vomiting.  Physical exam with some mild epigastric tenderness to palpation but is otherwise unremarkable.  Laboratory evaluation with a mild leukocytosis of 13.2, hemoglobin 17.4, potassium 3.4 is otherwise unremarkable.  Patient given Bentyl , Zofran  and lactated Ringer 's and on reevaluation his nausea has resolved but still has some mild epigastric abdominal pain.  GI cocktail and Pepcid  ordered and reevaluation by oncoming provider is pending at time of signout.  Suspect gastritis and will defer CT imaging unless pain not improving.  Please see provider signout note for continuation of workup.  Anticipate discharge.   Additional history obtained:  -External records from outside source obtained and reviewed including: Chart review including previous notes, labs, imaging, consultation notes   Lab Tests: -I ordered, reviewed, and interpreted labs.   The pertinent results include:   Labs Reviewed  CBC WITH DIFFERENTIAL/PLATELET - Abnormal; Notable for the following components:      Result Value   WBC 13.2 (*)    RBC 6.08 (*)     Hemoglobin 17.4 (*)    Neutro Abs 11.9 (*)    Lymphs Abs 0.6 (*)    All other components within normal limits  COMPREHENSIVE METABOLIC PANEL WITH GFR - Abnormal; Notable for the following components:   Potassium 3.4 (*)    CO2 21 (*)    Glucose, Bld 129 (*)    All other components within normal limits  LIPASE, BLOOD  URINALYSIS, ROUTINE W REFLEX MICROSCOPIC     Medicines ordered and prescription drug management: Meds ordered this encounter  Medications   lactated ringers  bolus 1,000 mL   ondansetron  (ZOFRAN ) injection 4 mg   dicyclomine  (BENTYL ) injection 20 mg   famotidine  (PEPCID ) IVPB 20 mg premix   AND Linked Order Group    alum & mag hydroxide-simeth (MAALOX/MYLANTA) 200-200-20 MG/5ML suspension 30 mL    lidocaine  (XYLOCAINE ) 2 % viscous mouth solution 15 mL    -I have reviewed the patients home medicines and have made adjustments as needed  Critical interventions none   Cardiac Monitoring: The patient was maintained on a cardiac monitor.  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  Please see provider signout for continuation of workup.     Final Clinical Impression(s) / ED Diagnoses Final diagnoses:  None     @PCDICTATION @    Carrissa Taitano, Lum, MD 12/09/23 [phone]

## 2023-12-16 DIAGNOSIS — F0632 Mood disorder due to known physiological condition with major depressive-like episode: Secondary | ICD-10-CM | POA: Diagnosis not present

## 2023-12-16 DIAGNOSIS — F411 Generalized anxiety disorder: Secondary | ICD-10-CM | POA: Diagnosis not present

## 2023-12-21 DIAGNOSIS — F411 Generalized anxiety disorder: Secondary | ICD-10-CM | POA: Diagnosis not present

## 2023-12-21 DIAGNOSIS — F410 Panic disorder [episodic paroxysmal anxiety] without agoraphobia: Secondary | ICD-10-CM | POA: Diagnosis not present

## 2023-12-30 DIAGNOSIS — F411 Generalized anxiety disorder: Secondary | ICD-10-CM | POA: Diagnosis not present

## 2023-12-30 DIAGNOSIS — F0632 Mood disorder due to known physiological condition with major depressive-like episode: Secondary | ICD-10-CM | POA: Diagnosis not present

## 2023-12-31 DIAGNOSIS — F411 Generalized anxiety disorder: Secondary | ICD-10-CM | POA: Diagnosis not present

## 2023-12-31 DIAGNOSIS — F1320 Sedative, hypnotic or anxiolytic dependence, uncomplicated: Secondary | ICD-10-CM | POA: Diagnosis not present

## 2023-12-31 DIAGNOSIS — F332 Major depressive disorder, recurrent severe without psychotic features: Secondary | ICD-10-CM | POA: Diagnosis not present

## 2023-12-31 DIAGNOSIS — F410 Panic disorder [episodic paroxysmal anxiety] without agoraphobia: Secondary | ICD-10-CM | POA: Diagnosis not present

## 2024-01-04 DIAGNOSIS — F410 Panic disorder [episodic paroxysmal anxiety] without agoraphobia: Secondary | ICD-10-CM | POA: Diagnosis not present

## 2024-01-04 DIAGNOSIS — F411 Generalized anxiety disorder: Secondary | ICD-10-CM | POA: Diagnosis not present

## 2024-01-06 DIAGNOSIS — F411 Generalized anxiety disorder: Secondary | ICD-10-CM | POA: Diagnosis not present

## 2024-01-06 DIAGNOSIS — F0632 Mood disorder due to known physiological condition with major depressive-like episode: Secondary | ICD-10-CM | POA: Diagnosis not present

## 2024-01-11 DIAGNOSIS — F4323 Adjustment disorder with mixed anxiety and depressed mood: Secondary | ICD-10-CM | POA: Diagnosis not present

## 2024-01-12 DIAGNOSIS — Z6836 Body mass index (BMI) 36.0-36.9, adult: Secondary | ICD-10-CM | POA: Diagnosis not present

## 2024-01-12 DIAGNOSIS — M542 Cervicalgia: Secondary | ICD-10-CM | POA: Diagnosis not present

## 2024-01-13 DIAGNOSIS — F0632 Mood disorder due to known physiological condition with major depressive-like episode: Secondary | ICD-10-CM | POA: Diagnosis not present

## 2024-01-13 DIAGNOSIS — F411 Generalized anxiety disorder: Secondary | ICD-10-CM | POA: Diagnosis not present

## 2024-01-19 ENCOUNTER — Telehealth: Payer: Self-pay | Admitting: Neurology

## 2024-01-19 ENCOUNTER — Encounter: Payer: Self-pay | Admitting: Neurology

## 2024-01-19 ENCOUNTER — Ambulatory Visit: Admitting: Neurology

## 2024-01-19 VITALS — BP 139/84 | HR 113 | Ht 71.0 in | Wt 266.0 lb

## 2024-01-19 DIAGNOSIS — Z87898 Personal history of other specified conditions: Secondary | ICD-10-CM | POA: Diagnosis not present

## 2024-01-19 DIAGNOSIS — F419 Anxiety disorder, unspecified: Secondary | ICD-10-CM
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
## 2024-01-19 NOTE — Telephone Encounter (Signed)
 Do not schedule patient with neurology until speaking to Dr. Ines, we have evaluated patient and returned to primary care and psychiatry

## 2024-01-20 DIAGNOSIS — F411 Generalized anxiety disorder: Secondary | ICD-10-CM | POA: Diagnosis not present

## 2024-01-20 DIAGNOSIS — F0632 Mood disorder due to known physiological condition with major depressive-like episode: Secondary | ICD-10-CM | POA: Diagnosis not present

## 2024-01-23 DIAGNOSIS — Z87898 Personal history of other specified conditions: Secondary | ICD-10-CM | POA: Insufficient documentation

## 2024-01-26 DIAGNOSIS — E559 Vitamin D deficiency, unspecified: Secondary | ICD-10-CM | POA: Diagnosis not present

## 2024-01-26 DIAGNOSIS — R5383 Other fatigue: Secondary | ICD-10-CM | POA: Diagnosis not present

## 2024-01-27 DIAGNOSIS — F411 Generalized anxiety disorder: Secondary | ICD-10-CM | POA: Diagnosis not present

## 2024-01-27 DIAGNOSIS — F0632 Mood disorder due to known physiological condition with major depressive-like episode: Secondary | ICD-10-CM | POA: Diagnosis not present

## 2024-01-29 ENCOUNTER — Other Ambulatory Visit: Payer: Self-pay | Admitting: Family Medicine

## 2024-01-29 DIAGNOSIS — E041 Nontoxic single thyroid nodule: Secondary | ICD-10-CM

## 2024-02-01 ENCOUNTER — Ambulatory Visit
Admission: RE | Admit: 2024-02-01 | Discharge: 2024-02-01 | Disposition: A | Discharge status: Home / Self Care | Source: Ambulatory Visit | Attending: Family Medicine | Admitting: Family Medicine

## 2024-02-01 DIAGNOSIS — E041 Nontoxic single thyroid nodule: Secondary | ICD-10-CM | POA: Diagnosis not present

## 2024-02-03 DIAGNOSIS — F411 Generalized anxiety disorder: Secondary | ICD-10-CM | POA: Diagnosis not present

## 2024-02-03 DIAGNOSIS — F0632 Mood disorder due to known physiological condition with major depressive-like episode: Secondary | ICD-10-CM | POA: Diagnosis not present

## 2024-02-05 DIAGNOSIS — E041 Nontoxic single thyroid nodule: Secondary | ICD-10-CM | POA: Diagnosis not present

## 2024-02-05 DIAGNOSIS — R5383 Other fatigue: Secondary | ICD-10-CM | POA: Diagnosis not present

## 2024-02-05 DIAGNOSIS — E291 Testicular hypofunction: Secondary | ICD-10-CM | POA: Diagnosis not present

## 2024-02-10 DIAGNOSIS — F411 Generalized anxiety disorder: Secondary | ICD-10-CM | POA: Diagnosis not present

## 2024-02-10 DIAGNOSIS — F0632 Mood disorder due to known physiological condition with major depressive-like episode: Secondary | ICD-10-CM | POA: Diagnosis not present

## 2024-02-15 ENCOUNTER — Other Ambulatory Visit: Payer: Self-pay | Admitting: Nurse Practitioner

## 2024-02-15 DIAGNOSIS — E041 Nontoxic single thyroid nodule: Secondary | ICD-10-CM | POA: Diagnosis not present

## 2024-02-17 DIAGNOSIS — F411 Generalized anxiety disorder: Secondary | ICD-10-CM | POA: Diagnosis not present

## 2024-02-17 DIAGNOSIS — F0632 Mood disorder due to known physiological condition with major depressive-like episode: Secondary | ICD-10-CM | POA: Diagnosis not present

## 2024-02-22 DIAGNOSIS — F4323 Adjustment disorder with mixed anxiety and depressed mood: Secondary | ICD-10-CM | POA: Diagnosis not present

## 2024-02-24 DIAGNOSIS — F411 Generalized anxiety disorder: Secondary | ICD-10-CM | POA: Diagnosis not present

## 2024-02-24 DIAGNOSIS — F0632 Mood disorder due to known physiological condition with major depressive-like episode: Secondary | ICD-10-CM | POA: Diagnosis not present

## 2024-02-25 ENCOUNTER — Other Ambulatory Visit (HOSPITAL_COMMUNITY)
Admission: RE | Admit: 2024-02-25 | Discharge: 2024-02-25 | Disposition: A | Discharge status: Home / Self Care | Source: Ambulatory Visit | Attending: Nurse Practitioner | Admitting: Nurse Practitioner

## 2024-02-25 ENCOUNTER — Ambulatory Visit
Admission: RE | Admit: 2024-02-25 | Discharge: 2024-02-25 | Disposition: A | Discharge status: Home / Self Care | Source: Ambulatory Visit | Attending: Nurse Practitioner | Admitting: Nurse Practitioner

## 2024-02-25 DIAGNOSIS — E041 Nontoxic single thyroid nodule: Secondary | ICD-10-CM | POA: Insufficient documentation

## 2024-02-25 DIAGNOSIS — M542 Cervicalgia: Secondary | ICD-10-CM | POA: Diagnosis not present

## 2024-02-25 DIAGNOSIS — Z6832 Body mass index (BMI) 32.0-32.9, adult: Secondary | ICD-10-CM | POA: Diagnosis not present

## 2024-02-26 ENCOUNTER — Other Ambulatory Visit: Payer: Self-pay | Admitting: Neurological Surgery

## 2024-02-26 DIAGNOSIS — M542 Cervicalgia: Secondary | ICD-10-CM

## 2024-02-26 LAB — CYTOLOGY - NON PAP

## 2024-02-29 DIAGNOSIS — F0632 Mood disorder due to known physiological condition with major depressive-like episode: Secondary | ICD-10-CM | POA: Diagnosis not present

## 2024-02-29 DIAGNOSIS — F411 Generalized anxiety disorder: Secondary | ICD-10-CM | POA: Diagnosis not present

## 2024-03-02 DIAGNOSIS — F0632 Mood disorder due to known physiological condition with major depressive-like episode: Secondary | ICD-10-CM | POA: Diagnosis not present

## 2024-03-02 DIAGNOSIS — F411 Generalized anxiety disorder: Secondary | ICD-10-CM | POA: Diagnosis not present

## 2024-03-04 ENCOUNTER — Ambulatory Visit
Admission: RE | Admit: 2024-03-04 | Discharge: 2024-03-04 | Disposition: A | Discharge status: Home / Self Care | Source: Ambulatory Visit | Attending: Neurological Surgery | Admitting: Neurological Surgery

## 2024-03-04 DIAGNOSIS — M542 Cervicalgia: Secondary | ICD-10-CM | POA: Diagnosis not present

## 2024-03-09 DIAGNOSIS — F0632 Mood disorder due to known physiological condition with major depressive-like episode: Secondary | ICD-10-CM | POA: Diagnosis not present

## 2024-03-09 DIAGNOSIS — F411 Generalized anxiety disorder: Secondary | ICD-10-CM | POA: Diagnosis not present

## 2024-03-16 DIAGNOSIS — F0632 Mood disorder due to known physiological condition with major depressive-like episode: Secondary | ICD-10-CM | POA: Diagnosis not present

## 2024-03-16 DIAGNOSIS — F411 Generalized anxiety disorder: Secondary | ICD-10-CM | POA: Diagnosis not present

## 2024-03-23 DIAGNOSIS — F0632 Mood disorder due to known physiological condition with major depressive-like episode: Secondary | ICD-10-CM | POA: Diagnosis not present

## 2024-03-23 DIAGNOSIS — F411 Generalized anxiety disorder: Secondary | ICD-10-CM | POA: Diagnosis not present

## 2024-03-28 DIAGNOSIS — F4323 Adjustment disorder with mixed anxiety and depressed mood: Secondary | ICD-10-CM | POA: Diagnosis not present

## 2024-03-30 DIAGNOSIS — F411 Generalized anxiety disorder: Secondary | ICD-10-CM | POA: Diagnosis not present

## 2024-03-30 DIAGNOSIS — F0632 Mood disorder due to known physiological condition with major depressive-like episode: Secondary | ICD-10-CM | POA: Diagnosis not present

## 2024-04-06 DIAGNOSIS — F0632 Mood disorder due to known physiological condition with major depressive-like episode: Secondary | ICD-10-CM | POA: Diagnosis not present

## 2024-04-06 DIAGNOSIS — F411 Generalized anxiety disorder: Secondary | ICD-10-CM | POA: Diagnosis not present

## 2024-04-07 DIAGNOSIS — Z6839 Body mass index (BMI) 39.0-39.9, adult: Secondary | ICD-10-CM | POA: Diagnosis not present

## 2024-04-11 DIAGNOSIS — F4323 Adjustment disorder with mixed anxiety and depressed mood: Secondary | ICD-10-CM | POA: Diagnosis not present

## 2024-04-13 DIAGNOSIS — F0632 Mood disorder due to known physiological condition with major depressive-like episode: Secondary | ICD-10-CM | POA: Diagnosis not present

## 2024-04-13 DIAGNOSIS — F411 Generalized anxiety disorder: Secondary | ICD-10-CM | POA: Diagnosis not present

## 2024-04-21 DIAGNOSIS — F411 Generalized anxiety disorder: Secondary | ICD-10-CM | POA: Diagnosis not present

## 2024-04-21 DIAGNOSIS — F0632 Mood disorder due to known physiological condition with major depressive-like episode: Secondary | ICD-10-CM | POA: Diagnosis not present

## 2024-04-25 DIAGNOSIS — F4323 Adjustment disorder with mixed anxiety and depressed mood: Secondary | ICD-10-CM | POA: Diagnosis not present

## 2024-04-27 DIAGNOSIS — F411 Generalized anxiety disorder: Secondary | ICD-10-CM | POA: Diagnosis not present

## 2024-04-27 DIAGNOSIS — F0632 Mood disorder due to known physiological condition with major depressive-like episode: Secondary | ICD-10-CM | POA: Diagnosis not present

## 2024-05-02 DIAGNOSIS — F4323 Adjustment disorder with mixed anxiety and depressed mood: Secondary | ICD-10-CM | POA: Diagnosis not present

## 2024-05-04 DIAGNOSIS — F411 Generalized anxiety disorder: Secondary | ICD-10-CM | POA: Diagnosis not present

## 2024-05-04 DIAGNOSIS — F0632 Mood disorder due to known physiological condition with major depressive-like episode: Secondary | ICD-10-CM | POA: Diagnosis not present

## 2024-05-11 DIAGNOSIS — F411 Generalized anxiety disorder: Secondary | ICD-10-CM | POA: Diagnosis not present

## 2024-05-11 DIAGNOSIS — F0632 Mood disorder due to known physiological condition with major depressive-like episode: Secondary | ICD-10-CM | POA: Diagnosis not present

## 2024-05-18 DIAGNOSIS — F411 Generalized anxiety disorder: Secondary | ICD-10-CM | POA: Diagnosis not present

## 2024-05-18 DIAGNOSIS — F0632 Mood disorder due to known physiological condition with major depressive-like episode: Secondary | ICD-10-CM | POA: Diagnosis not present

## 2024-05-25 DIAGNOSIS — F411 Generalized anxiety disorder: Secondary | ICD-10-CM | POA: Diagnosis not present

## 2024-05-25 DIAGNOSIS — F0632 Mood disorder due to known physiological condition with major depressive-like episode: Secondary | ICD-10-CM | POA: Diagnosis not present

## 2024-05-30 DIAGNOSIS — F0632 Mood disorder due to known physiological condition with major depressive-like episode: Secondary | ICD-10-CM | POA: Diagnosis not present

## 2024-05-30 DIAGNOSIS — F411 Generalized anxiety disorder: Secondary | ICD-10-CM | POA: Diagnosis not present
